# Patient Record
Sex: Male | Born: 1950 | Race: White | Hispanic: No | Marital: Married | Smoking: Never smoker
Health system: Southern US, Community
[De-identification: ages and names within clinical notes are randomized; demographics above are authoritative.]

## PROBLEM LIST (undated history)

## (undated) DIAGNOSIS — I2510 Atherosclerotic heart disease of native coronary artery without angina pectoris: Secondary | ICD-10-CM

## (undated) DIAGNOSIS — M1990 Unspecified osteoarthritis, unspecified site: Secondary | ICD-10-CM

## (undated) DIAGNOSIS — C801 Malignant (primary) neoplasm, unspecified: Secondary | ICD-10-CM

---

## 2017-12-25 ENCOUNTER — Ambulatory Visit: Payer: Self-pay | Admitting: Orthopedic Surgery

## 2017-12-29 HISTORY — PX: CARDIAC CATHETERIZATION: SHX172

## 2018-01-19 ENCOUNTER — Encounter (HOSPITAL_COMMUNITY)
Admission: RE | Admit: 2018-01-19 | Discharge: 2018-01-19 | Disposition: A | Discharge status: Home / Self Care | Payer: Medicare Other | Source: Ambulatory Visit | Attending: Orthopedic Surgery | Admitting: Orthopedic Surgery

## 2018-01-19 ENCOUNTER — Encounter (HOSPITAL_COMMUNITY): Payer: Self-pay

## 2018-01-19 ENCOUNTER — Other Ambulatory Visit: Payer: Self-pay

## 2018-01-19 DIAGNOSIS — M1611 Unilateral primary osteoarthritis, right hip: Secondary | ICD-10-CM | POA: Insufficient documentation

## 2018-01-19 DIAGNOSIS — Z7982 Long term (current) use of aspirin: Secondary | ICD-10-CM | POA: Diagnosis not present

## 2018-01-19 DIAGNOSIS — Z01818 Encounter for other preprocedural examination: Secondary | ICD-10-CM | POA: Insufficient documentation

## 2018-01-19 DIAGNOSIS — Z79899 Other long term (current) drug therapy: Secondary | ICD-10-CM | POA: Insufficient documentation

## 2018-01-19 HISTORY — DX: Unspecified osteoarthritis, unspecified site: M19.90

## 2018-01-19 HISTORY — DX: Malignant (primary) neoplasm, unspecified: C80.1

## 2018-01-19 LAB — COMPREHENSIVE METABOLIC PANEL WITH GFR
ALT: 19 U/L (ref 17–63)
AST: 21 U/L (ref 15–41)
Albumin: 3.9 g/dL (ref 3.5–5.0)
Alkaline Phosphatase: 78 U/L (ref 38–126)
Anion gap: 9 (ref 5–15)
BUN: 21 mg/dL — ABNORMAL HIGH (ref 6–20)
CO2: 26 mmol/L (ref 22–32)
Calcium: 9.1 mg/dL (ref 8.9–10.3)
Chloride: 106 mmol/L (ref 101–111)
Creatinine, Ser: 0.85 mg/dL (ref 0.61–1.24)
GFR calc Af Amer: >60 mL/min (ref >60)
GFR calc non Af Amer: >60 mL/min (ref >60)
Glucose, Bld: 88 mg/dL (ref 65–99)
Potassium: 4.3 mmol/L (ref 3.5–5.1)
Sodium: 141 mmol/L (ref 135–145)
Total Bilirubin: 0.9 mg/dL (ref 0.3–1.2)
Total Protein: 6.5 g/dL (ref 6.5–8.1)

## 2018-01-19 LAB — PROTIME-INR
INR: 1.02
PROTHROMBIN TIME: 13.3 s (ref 11.4–15.2)

## 2018-01-19 LAB — APTT: aPTT: 32 s (ref 24–36)

## 2018-01-19 LAB — CBC
HCT: 39 % (ref 39.0–52.0)
Hemoglobin: 13.9 g/dL (ref 13.0–17.0)
MCH: 33.3 pg (ref 26.0–34.0)
MCHC: 35.6 g/dL (ref 30.0–36.0)
MCV: 93.3 fL (ref 78.0–100.0)
Platelets: 233 K/uL (ref 150–400)
RBC: 4.18 MIL/uL — ABNORMAL LOW (ref 4.22–5.81)
RDW: 12.8 % (ref 11.5–15.5)
WBC: 7.4 K/uL (ref 4.0–10.5)

## 2018-01-19 LAB — SURGICAL PCR SCREEN
MRSA, PCR: NEGATIVE
Staphylococcus aureus: POSITIVE — AB

## 2018-01-19 NOTE — Patient Instructions (Addendum)
Johnny Graves  01/19/2018   Your procedure is scheduled on: 02-03-18   Report to [facility] Main  Entrance to Admitting at 8:15AM    Call this number if you have problems the morning of surgery [phone]   Remember: Do not eat food or drink liquids :After Midnight.     Take these medicines the morning of surgery with A SIP OF WATER: None                                You may not have any metal on your body including hair pins and              piercings  Do not wear jewelry, lotions, powders or deodorant             Men may shave face and neck.   Do not bring valuables to the hospital. [place].  Contacts, dentures or bridgework may not be worn into surgery.  Leave suitcase in the car. After surgery it may be brought to your room.                Please read over the following fact sheets you were given: _____________________________________________________________________          [facility] - Preparing for Surgery Before surgery, you can play an important role.  Because skin is not sterile, your skin needs to be as free of germs as possible.  You can reduce the number of germs on your skin by washing with CHG (chlorahexidine gluconate) soap before surgery.  CHG is an antiseptic cleaner which kills germs and bonds with the skin to continue killing germs even after washing. Please DO NOT use if you have an allergy to CHG or antibacterial soaps.  If your skin becomes reddened/irritated stop using the CHG and inform your nurse when you arrive at Short Stay. Do not shave (including legs and underarms) for at least 48 hours prior to the first CHG shower.  You may shave your face/neck. Please follow these instructions carefully:  1.  Shower with CHG Soap the night before surgery and the  morning of Surgery.  2.  If you choose to wash your hair, wash your hair first as usual with your  normal  shampoo.  3.   After you shampoo, rinse your hair and body thoroughly to remove the  shampoo.                           4.  Use CHG as you would any other liquid soap.  You can apply chg directly  to the skin and wash                       Gently with a scrungie or clean washcloth.  5.  Apply the CHG Soap to your body ONLY FROM THE NECK DOWN.   Do not use on face/ open                           Wound or open sores. Avoid contact with eyes, ears mouth and genitals (private parts).  Wash face,  Genitals (private parts) with your normal soap.             6.  Wash thoroughly, paying special attention to the area where your surgery  will be performed.  7.  Thoroughly rinse your body with warm water from the neck down.  8.  DO NOT shower/wash with your normal soap after using and rinsing off  the CHG Soap.                9.  Pat yourself dry with a clean towel.            10.  Wear clean pajamas.            11.  Place clean sheets on your bed the night of your first shower and do not  sleep with pets. Day of Surgery : Do not apply any lotions/deodorants the morning of surgery.  Please wear clean clothes to the hospital/surgery center.  FAILURE TO FOLLOW THESE INSTRUCTIONS MAY RESULT IN THE CANCELLATION OF YOUR SURGERY PATIENT SIGNATURE_________________________________  NURSE SIGNATURE__________________________________  ________________________________________________________________________   Johnny Graves  An incentive spirometer is a tool that can help keep your lungs clear and active. This tool measures how well you are filling your lungs with each breath. Taking long deep breaths may help reverse or decrease the chance of developing breathing (pulmonary) problems (especially infection) following:  A long period of time when you are unable to move or be active. BEFORE THE PROCEDURE   If the spirometer includes an indicator to show your best effort, your nurse or respiratory  therapist will set it to a desired goal.  If possible, sit up straight or lean slightly forward. Try not to slouch.  Hold the incentive spirometer in an upright position. INSTRUCTIONS FOR USE  1. Sit on the edge of your bed if possible, or sit up as far as you can in bed or on a chair. 2. Hold the incentive spirometer in an upright position. 3. Breathe out normally. 4. Place the mouthpiece in your mouth and seal your lips tightly around it. 5. Breathe in slowly and as deeply as possible, raising the piston or the ball toward the top of the column. 6. Hold your breath for 3-5 seconds or for as long as possible. Allow the piston or ball to fall to the bottom of the column. 7. Remove the mouthpiece from your mouth and breathe out normally. 8. Rest for a few seconds and repeat Steps 1 through 7 at least 10 times every 1-2 hours when you are awake. Take your time and take a few normal breaths between deep breaths. 9. The spirometer may include an indicator to show your best effort. Use the indicator as a goal to work toward during each repetition. 10. After each set of 10 deep breaths, practice coughing to be sure your lungs are clear. If you have an incision (the cut made at the time of surgery), support your incision when coughing by placing a pillow or rolled up towels firmly against it. Once you are able to get out of bed, walk around indoors and cough well. You may stop using the incentive spirometer when instructed by your caregiver.  RISKS AND COMPLICATIONS  Take your time so you do not get dizzy or light-headed.  If you are in pain, you may need to take or ask for pain medication before doing incentive spirometry. It is harder to take a deep breath if you are having pain.  AFTER USE  Rest and breathe slowly and easily.  It can be helpful to keep track of a log of your progress. Your caregiver can provide you with a simple table to help with this. If you are using the spirometer at home,  follow these instructions: [place] IF:   You are having difficultly using the spirometer.  You have trouble using the spirometer as often as instructed.  Your pain medication is not giving enough relief while using the spirometer.  You develop fever of 100.5 F (38.1 C) or higher. SEEK IMMEDIATE MEDICAL CARE IF:   You cough up bloody sputum that had not been present before.  You develop fever of 102 F (38.9 C) or greater.  You develop worsening pain at or near the incision site. MAKE SURE YOU:   Understand these instructions.  Will watch your condition.  Will get help right away if you are not doing well or get worse. Document Released: 04/06/2007 Document Revised: 02/16/2012 Document Reviewed: 06/07/2007 ExitCare Patient Information 2014 ExitCare, [place].   ________________________________________________________________________  WHAT IS A BLOOD TRANSFUSION? Blood Transfusion Information  A transfusion is the replacement of blood or some of its parts. Blood is made up of multiple cells which provide different functions.  Red blood cells carry oxygen and are used for blood loss replacement.  White blood cells fight against infection.  Platelets control bleeding.  Plasma helps clot blood.  Other blood products are available for specialized needs, such as hemophilia or other clotting disorders. BEFORE THE TRANSFUSION  Who gives blood for transfusions?   Healthy volunteers who are fully evaluated to make sure their blood is safe. This is blood bank blood. Transfusion therapy is the safest it has ever been in the practice of medicine. Before blood is taken from a donor, a complete history is taken to make sure that person has no history of diseases nor engages in risky social behavior (examples are intravenous drug use or sexual activity with multiple partners). The donor's travel history is screened to minimize risk of transmitting infections, such as malaria.  The donated blood is tested for signs of infectious diseases, such as HIV and hepatitis. The blood is then tested to be sure it is compatible with you in order to minimize the chance of a transfusion reaction. If you or a relative donates blood, this is often done in anticipation of surgery and is not appropriate for emergency situations. It takes many days to process the donated blood. RISKS AND COMPLICATIONS Although transfusion therapy is very safe and saves many lives, the main dangers of transfusion include:   Getting an infectious disease.  Developing a transfusion reaction. This is an allergic reaction to something in the blood you were given. Every precaution is taken to prevent this. The decision to have a blood transfusion has been considered carefully by your caregiver before blood is given. Blood is not given unless the benefits outweigh the risks. AFTER THE TRANSFUSION  Right after receiving a blood transfusion, you will usually feel much better and more energetic. This is especially true if your red blood cells have gotten low (anemic). The transfusion raises the level of the red blood cells which carry oxygen, and this usually causes an energy increase.  The nurse administering the transfusion will monitor you carefully for complications. HOME CARE INSTRUCTIONS  No special instructions are needed after a transfusion. You may find your energy is better. Speak with your caregiver about any limitations on activity for underlying diseases  you may have. SEEK MEDICAL CARE IF:   Your condition is not improving after your transfusion.  You develop redness or irritation at the intravenous (IV) site. SEEK IMMEDIATE MEDICAL CARE IF:  Any of the following symptoms occur over the next 12 hours:  Shaking chills.  You have a temperature by mouth above 102 F (38.9 C), not controlled by medicine.  Chest, back, or muscle pain.  People around you feel you are not acting correctly or are  confused.  Shortness of breath or difficulty breathing.  Dizziness and fainting.  You get a rash or develop hives.  You have a decrease in urine output.  Your urine turns a dark color or changes to pink, red, or brown. Any of the following symptoms occur over the next 10 days:  You have a temperature by mouth above 102 F (38.9 C), not controlled by medicine.  Shortness of breath.  Weakness after normal activity.  The white part of the eye turns yellow (jaundice).  You have a decrease in the amount of urine or are urinating less often.  Your urine turns a dark color or changes to pink, red, or brown. Document Released: 11/21/2000 Document Revised: 02/16/2012 Document Reviewed: 07/10/2008 [facility] Patient Information 2014 [place], [place].  _______________________________________________________________________

## 2018-01-20 ENCOUNTER — Ambulatory Visit: Payer: Self-pay | Admitting: Orthopedic Surgery

## 2018-01-20 NOTE — H&P (Signed)
Name  Johnny Graves, Johnny Graves [phone], M) ID# [id number]     DOB  12/12/1950             Chief Complaint  Right Hip Pain  H&P for right THA on 02/03/2018 at [facility]    Patient's Pharmacies  [id number]): [address], [place] [address], Ph [phone], Fax [phone]   Vitals  01/19/2018 10:18 am Ht:  6 ft   Wt:  170 lbs   BMI:  23.1   Pain Scale:  6       BP - 138/54 Pulse - 54  Allergies   Reviewed Allergies   NKDA     Medications   Reviewed Medications       Aspir-81  01/19/18 entered  Johnny Graves   CoQ-10  01/19/18 entered  Johnny Graves   diclofenac sodium 75 mg tablet,delayed release  12/03/17 filled  [facility]   Fish Oil 1,000 mg (120 mg-180 mg) capsule  Take by oral route.  01/19/18 entered  Johnny Graves   Glucosamine 1500 Complex 500 mg-400 mg capsule  Take by oral route.  01/19/18 entered  Johnny Graves   Niacin (niacinamide) 500 mg tablet  Take by oral route.  01/19/18 entered  Johnny Graves   red yeast rice 600 mg capsule  Take by oral route.  01/19/18 entered  Johnny Graves   Problems  Reviewed Problems   Family History  Reviewed Family History Father  - Father deceased    - Heart disease   Mother  - Mother deceased    - Family history of cancer     Social History  Reviewed Social History Smoking Status: Never smoker Non-smoker Occupation: Sports coach Chewing tobacco: none Alcohol intake: None Hand Dominance: Right Work related injury?: N Advance directive: N Freight forwarder of Attorney: N    Surgical History  Reviewed Surgical History Patient indicated no previous surgeries on (01/19/2018)    Past Medical History  Reviewed Past Medical History Osteoarthritis: Y Notes: Skin Cancer     HPI  The patient comes in for his H&P for his right THA on 02/03/2018 by Dr. Wynelle Link at [facility].  The patient was initially seen for a second opinion. The patient reported left hip  and right hip problems including pain (mainly right hip) symptoms that have been present for 1 year(s). The symptoms began without any known injury. Symptoms reported include hip pain, difficulty flexing hip, difficulty rotating hip, difficulty bearing weight and difficulty ambulating The patient reports symptoms radiating to the: lower back. Onset of symptoms was gradual. The patient feels as if their symptoms are does feel they are worsening. Symptoms are exacerbated by movement, flexing hip, weight bearing, walking and lying on the affected side. Symptoms are relieved by rest. Prior to being seen in the clinic, the patient was previously evaluated by an out of town physician ([place], [place]) 2 month(s) ago. Symptoms present at the patient's previous evaluation included pain in the hip, difficulty bearing weight and difficulty ambulating. Previous workup for this problem has included hip x-rays and lumbar spine xrays.  Johnny Graves has been told that both of his hips have arthritis but the right hip seems to be the more problematic of the two. The patient states that he has been a lifelong runner but had to stopped due to pain and discomfort ab out a year and a half ago. His hip pain has been progressive with time especially since February of this  year. He went and saw Dr. Megan Salon in [place] and was told that he had arthritis and was started on Diclofenac which helped for quite some time (about 4-5 months). The pain started to return so he was switched over to Etodolac which did not provide much benefit. He then tried Celebrex without benefit. He is now back on the original diclofenac medicine. 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 Once his pain started, he stopped running as stated above and walking the courses became more difficult. he tries to remain active and works out but has gone less frequently over the past three  months. He went back to work about a year and a half ago and fortunately has not missed any work due to the hips but they remain progressive in nature.  Radiographs-AP pelvis and lateral of the hips show severe end-stage arthritic change in both hips with severe bone-on-bone with subchondral cystic formation and osteophyte formation. He has collapse of the RIGHT femoral head. Both sides are severely involved but the RIGHT is slightly worse.  He has advanced end-stage arthritis of both hips RIGHT more symptomatic and worse radiographically than the LEFT. We discussed intra-articular injection for short-term benefit but the most predictable means of long-term improvement in pain and function will be total hip arthroplasty.We discussed the procedure risks and potential complication and rehab course in detail. At this time the patient would like to go ahead and proceed with the total hip arthroplasty on the right.    ROS  Constitutional: Constitutional: no fever, chills, night sweats, or significant weight loss.  Cardiovascular: Cardiovascular: no palpitations or chest pain.  Respiratory: Respiratory: no cough or shortness of breath and No COPD.  Gastrointestinal: Gastrointestinal: no vomiting or nausea.  Musculoskeletal: Musculoskeletal: Joint Pain.  Neurologic: Neurologic: no numbness, tingling, or difficulty with balance.     Physical Exam   Patient is a 67 year old male.  General Mental Status - Alert, cooperative and good historian. General Appearance - pleasant, Not in acute distress. Orientation - Oriented X3. Build & Nutrition - Well nourished and Well developed. Head and Neck Head - normocephalic, atraumatic . Neck Global Assessment - supple, no bruit auscultated on the right, no bruit auscultated on the left. Eye Pupil - Bilateral - PERR  Motion - Bilateral - EOMI. Chest and Lung Exam Auscultation Breath sounds - clear at anterior chest wall and clear at posterior chest wall.  Adventitious sounds - No Adventitious sounds. Cardiovascular Auscultation Rhythm - Regular rate and rhythm. Heart Sounds - S1 WNL and S2 WNL. Murmurs & Other Heart Sounds - Auscultation of the heart reveals - No Murmurs. Abdomen Palpation/Percussion Tenderness - Abdomen is non-tender to palpation. Abdomen is soft. Auscultation Auscultation of the abdomen reveals - Bowel sounds normal. Male Genitourinary Note: Not done, not pertinent to present illness Musculoskeletal His RIGHT hip can be flexed to 100 with minimal internal rotation, 10 of external rotation and 10 of abduction. His LEFT hip can be flexed to 100 with about 10 of internal rotation maybe 20 of external rotation 20 of abduction. He has an antalgic gait pattern. Knee exam is normal bilaterally. Pulses, sensation and motor are intact.  Radiographs-AP pelvis and lateral of the hips show severe end-stage arthritic change in both hips with severe bone-on-bone with subchondral cystic formation and osteophyte formation. He has collapse of the RIGHT femoral head. Both sides are severely involved but the RIGHT is slightly worse. Marland Kitchen  Assessment / Plan   1. 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 - Follow up visit in 2 weeks with Dr. Wynelle Link - Begin physical therapy following surgery - Pre-operative lab work as pre Pre-Surgical Testing - Prescriptions will be provided in hospital at time of discharge  Return to [place], PA-C for [place] at [facility] on 02/17/2018 at 02:15 PM    Encounter signed-off by Mickel Crow, PA-C

## 2018-01-20 NOTE — Progress Notes (Signed)
01-20-18 PCR results routed to Dr. Wynelle Link for review.

## 2018-02-03 ENCOUNTER — Encounter (HOSPITAL_COMMUNITY): Admission: RE | Payer: Self-pay | Source: Ambulatory Visit

## 2018-02-03 ENCOUNTER — Inpatient Hospital Stay (HOSPITAL_COMMUNITY): Admission: RE | Admit: 2018-02-03 | Payer: Medicare Other | Source: Ambulatory Visit | Admitting: Orthopedic Surgery

## 2018-02-03 SURGERY — ARTHROPLASTY, HIP, TOTAL, ANTERIOR APPROACH
Anesthesia: Choice | Site: Hip | Laterality: Right

## 2018-02-05 HISTORY — PX: JOINT REPLACEMENT: SHX530

## 2018-02-12 NOTE — Progress Notes (Signed)
Please place orders in Epic as patient is being scheduled for a pre-op appointment! Thank you! 

## 2018-02-15 ENCOUNTER — Ambulatory Visit: Payer: Self-pay | Admitting: Orthopedic Surgery

## 2018-02-18 NOTE — Progress Notes (Addendum)
02-12-18 Cardiac Clearance on chart from [facility]  02-01-18 LOV Dr. Sabra Heck Cardiologist  02-01-18 Stress Test

## 2018-02-18 NOTE — Patient Instructions (Addendum)
Johnny Graves  02/18/2018   Your procedure is scheduled on: 02-22-18   Report to [facility] Main  Entrance Report to Admitting at 11:15 AM   Call this number if you have problems the morning of surgery [phone]   Remember: Do not eat food or drink liquids :After Midnight. You may have Clear Liquid Diet from Midnight until 7:45 AM. After 7:45 AM, nothing until after surgery.     CLEAR LIQUID DIET   Foods Allowed                                                                     Foods Excluded  Coffee and tea, regular and decaf                             liquids that you cannot  Plain Jell-O in any flavor                                             see through such as: Fruit ices (not with fruit pulp)                                     milk, soups, orange juice  Iced Popsicles                                    All solid food Carbonated beverages, regular and diet                                    Cranberry, grape and apple juices Sports drinks like Gatorade Lightly seasoned clear broth or consume(fat free) Sugar, honey syrup  Sample Menu Breakfast                                Lunch                                     Supper Cranberry juice                    Beef broth                            Chicken broth Jell-O                                     Grape juice                           Apple juice Coffee  or tea                        Jell-O                                      Popsicle                                                Coffee or tea                        Coffee or tea  _____________________________________________________________________     Take these medicines the morning of surgery with A SIP OF WATER: Metoprolol Succinate (Toprol-XL)                                You may not have any metal on your body including hair pins and              piercings  Do not wear jewelry, lotions, powders or deodorant             Men  may shave face and neck.   Do not bring valuables to the hospital. [place].  Contacts, dentures or bridgework may not be worn into surgery.  Leave suitcase in the car. After surgery it may be brought to your room.                 Please read over the following fact sheets you were given: _____________________________________________________________________          [facility] - Preparing for Surgery Before surgery, you can play an important role.  Because skin is not sterile, your skin needs to be as free of germs as possible.  You can reduce the number of germs on your skin by washing with CHG (chlorahexidine gluconate) soap before surgery.  CHG is an antiseptic cleaner which kills germs and bonds with the skin to continue killing germs even after washing. Please DO NOT use if you have an allergy to CHG or antibacterial soaps.  If your skin becomes reddened/irritated stop using the CHG and inform your nurse when you arrive at Short Stay. Do not shave (including legs and underarms) for at least 48 hours prior to the first CHG shower.  You may shave your face/neck. Please follow these instructions carefully:  1.  Shower with CHG Soap the night before surgery and the  morning of Surgery.  2.  If you choose to wash your hair, wash your hair first as usual with your  normal  shampoo.  3.  After you shampoo, rinse your hair and body thoroughly to remove the  shampoo.                           4.  Use CHG as you would any other liquid soap.  You can apply chg directly  to the skin and wash  Gently with a scrungie or clean washcloth.  5.  Apply the CHG Soap to your body ONLY FROM THE NECK DOWN.   Do not use on face/ open                           Wound or open sores. Avoid contact with eyes, ears mouth and genitals (private parts).                       Wash face,  Genitals (private parts) with your normal soap.             6.   Wash thoroughly, paying special attention to the area where your surgery  will be performed.  7.  Thoroughly rinse your body with warm water from the neck down.  8.  DO NOT shower/wash with your normal soap after using and rinsing off  the CHG Soap.                9.  Pat yourself dry with a clean towel.            10.  Wear clean pajamas.            11.  Place clean sheets on your bed the night of your first shower and do not  sleep with pets. Day of Surgery : Do not apply any lotions/deodorants the morning of surgery.  Please wear clean clothes to the hospital/surgery center.  FAILURE TO FOLLOW THESE INSTRUCTIONS MAY RESULT IN THE CANCELLATION OF YOUR SURGERY PATIENT SIGNATURE_________________________________  NURSE SIGNATURE__________________________________  ________________________________________________________________________   [name]  An incentive spirometer is a tool that can help keep your lungs clear and active. This tool measures how well you are filling your lungs with each breath. Taking long deep breaths may help reverse or decrease the chance of developing breathing (pulmonary) problems (especially infection) following:  A long period of time when you are unable to move or be active. BEFORE THE PROCEDURE   If the spirometer includes an indicator to show your best effort, your nurse or respiratory therapist will set it to a desired goal.  If possible, sit up straight or lean slightly forward. Try not to slouch.  Hold the incentive spirometer in an upright position. INSTRUCTIONS FOR USE  1. Sit on the edge of your bed if possible, or sit up as far as you can in bed or on a chair. 2. Hold the incentive spirometer in an upright position. 3. Breathe out normally. 4. Place the mouthpiece in your mouth and seal your lips tightly around it. 5. Breathe in slowly and as deeply as possible, raising the piston or the ball toward the top of the column. 6. Hold  your breath for 3-5 seconds or for as long as possible. Allow the piston or ball to fall to the bottom of the column. 7. Remove the mouthpiece from your mouth and breathe out normally. 8. Rest for a few seconds and repeat Steps 1 through 7 at least 10 times every 1-2 hours when you are awake. Take your time and take a few normal breaths between deep breaths. 9. The spirometer may include an indicator to show your best effort. Use the indicator as a goal to work toward during each repetition. 10. After each set of 10 deep breaths, practice coughing to be sure your lungs are clear. If you have an incision (the cut made at the time of  surgery), support your incision when coughing by placing a pillow or rolled up towels firmly against it. Once you are able to get out of bed, walk around indoors and cough well. You may stop using the incentive spirometer when instructed by your caregiver.  RISKS AND COMPLICATIONS  Take your time so you do not get dizzy or light-headed.  If you are in pain, you may need to take or ask for pain medication before doing incentive spirometry. It is harder to take a deep breath if you are having pain. AFTER USE  Rest and breathe slowly and easily.  It can be helpful to keep track of a log of your progress. Your caregiver can provide you with a simple table to help with this. If you are using the spirometer at home, follow these instructions: [place] IF:   You are having difficultly using the spirometer.  You have trouble using the spirometer as often as instructed.  Your pain medication is not giving enough relief while using the spirometer.  You develop fever of 100.5 F (38.1 C) or higher. SEEK IMMEDIATE MEDICAL CARE IF:   You cough up bloody sputum that had not been present before.  You develop fever of 102 F (38.9 C) or greater.  You develop worsening pain at or near the incision site. MAKE SURE YOU:   Understand these instructions.  Will  watch your condition.  Will get help right away if you are not doing well or get worse. Document Released: 04/06/2007 Document Revised: 02/16/2012 Document Reviewed: 06/07/2007 ExitCare Patient Information 2014 ExitCare, [place].   ________________________________________________________________________  WHAT IS A BLOOD TRANSFUSION? Blood Transfusion Information  A transfusion is the replacement of blood or some of its parts. Blood is made up of multiple cells which provide different functions.  Red blood cells carry oxygen and are used for blood loss replacement.  White blood cells fight against infection.  Platelets control bleeding.  Plasma helps clot blood.  Other blood products are available for specialized needs, such as hemophilia or other clotting disorders. BEFORE THE TRANSFUSION  Who gives blood for transfusions?   Healthy volunteers who are fully evaluated to make sure their blood is safe. This is blood bank blood. Transfusion therapy is the safest it has ever been in the practice of medicine. Before blood is taken from a donor, a complete history is taken to make sure that person has no history of diseases nor engages in risky social behavior (examples are intravenous drug use or sexual activity with multiple partners). The donor's travel history is screened to minimize risk of transmitting infections, such as malaria. The donated blood is tested for signs of infectious diseases, such as HIV and hepatitis. The blood is then tested to be sure it is compatible with you in order to minimize the chance of a transfusion reaction. If you or a relative donates blood, this is often done in anticipation of surgery and is not appropriate for emergency situations. It takes many days to process the donated blood. RISKS AND COMPLICATIONS Although transfusion therapy is very safe and saves many lives, the main dangers of transfusion include:   Getting an infectious disease.  Developing a  transfusion reaction. This is an allergic reaction to something in the blood you were given. Every precaution is taken to prevent this. The decision to have a blood transfusion has been considered carefully by your caregiver before blood is given. Blood is not given unless the benefits outweigh the risks. AFTER THE  TRANSFUSION  Right after receiving a blood transfusion, you will usually feel much better and more energetic. This is especially true if your red blood cells have gotten low (anemic). The transfusion raises the level of the red blood cells which carry oxygen, and this usually causes an energy increase.  The nurse administering the transfusion will monitor you carefully for complications. HOME CARE INSTRUCTIONS  No special instructions are needed after a transfusion. You may find your energy is better. Speak with your caregiver about any limitations on activity for underlying diseases you may have. SEEK MEDICAL CARE IF:   Your condition is not improving after your transfusion.  You develop redness or irritation at the intravenous (IV) site. SEEK IMMEDIATE MEDICAL CARE IF:  Any of the following symptoms occur over the next 12 hours:  Shaking chills.  You have a temperature by mouth above 102 F (38.9 C), not controlled by medicine.  Chest, back, or muscle pain.  People around you feel you are not acting correctly or are confused.  Shortness of breath or difficulty breathing.  Dizziness and fainting.  You get a rash or develop hives.  You have a decrease in urine output.  Your urine turns a dark color or changes to pink, red, or brown. Any of the following symptoms occur over the next 10 days:  You have a temperature by mouth above 102 F (38.9 C), not controlled by medicine.  Shortness of breath.  Weakness after normal activity.  The white part of the eye turns yellow (jaundice).  You have a decrease in the amount of urine or are urinating less often.  Your  urine turns a dark color or changes to pink, red, or brown. Document Released: 11/21/2000 Document Revised: 02/16/2012 Document Reviewed: 07/10/2008 [facility] Patient Information 2014 [place], [place].  _______________________________________________________________________

## 2018-02-19 ENCOUNTER — Ambulatory Visit: Payer: Self-pay | Admitting: Orthopedic Surgery

## 2018-02-19 ENCOUNTER — Encounter (HOSPITAL_COMMUNITY): Payer: Self-pay

## 2018-02-19 ENCOUNTER — Other Ambulatory Visit: Payer: Self-pay

## 2018-02-19 ENCOUNTER — Encounter (HOSPITAL_COMMUNITY)
Admission: RE | Admit: 2018-02-19 | Discharge: 2018-02-19 | Disposition: A | Discharge status: Home / Self Care | Payer: Medicare Other | Source: Ambulatory Visit | Attending: Orthopedic Surgery | Admitting: Orthopedic Surgery

## 2018-02-19 LAB — CBC
HCT: 41.8 % (ref 39.0–52.0)
HEMOGLOBIN: 14.8 g/dL (ref 13.0–17.0)
MCH: 32.7 pg (ref 26.0–34.0)
MCHC: 35.4 g/dL (ref 30.0–36.0)
MCV: 92.3 fL (ref 78.0–100.0)
PLATELETS: 297 10*3/uL (ref 150–400)
RBC: 4.53 MIL/uL (ref 4.22–5.81)
RDW: 12.3 % (ref 11.5–15.5)
WBC: 8.9 10*3/uL (ref 4.0–10.5)

## 2018-02-19 LAB — ABO/RH: ABO/RH(D): O POS

## 2018-02-19 LAB — COMPREHENSIVE METABOLIC PANEL
ALBUMIN: 3.9 g/dL (ref 3.5–5.0)
ALK PHOS: 90 U/L (ref 38–126)
ALT: 14 U/L — ABNORMAL LOW (ref 17–63)
ANION GAP: 10 (ref 5–15)
AST: 16 U/L (ref 15–41)
BUN: 20 mg/dL (ref 6–20)
CHLORIDE: 104 mmol/L (ref 101–111)
CO2: 25 mmol/L (ref 22–32)
Calcium: 9.1 mg/dL (ref 8.9–10.3)
Creatinine, Ser: 0.9 mg/dL (ref 0.61–1.24)
GFR calc non Af Amer: >60 mL/min (ref >60)
GLUCOSE: 92 mg/dL (ref 65–99)
POTASSIUM: 4.2 mmol/L (ref 3.5–5.1)
SODIUM: 139 mmol/L (ref 135–145)
Total Bilirubin: 0.8 mg/dL (ref 0.3–1.2)
Total Protein: 6.8 g/dL (ref 6.5–8.1)

## 2018-02-19 LAB — SURGICAL PCR SCREEN
MRSA, PCR: NEGATIVE
Staphylococcus aureus: NEGATIVE

## 2018-02-19 LAB — APTT: APTT: 31 s (ref 24–36)

## 2018-02-19 LAB — PROTIME-INR
INR: 1
Prothrombin Time: 13.1 seconds (ref 11.4–15.2)

## 2018-02-19 NOTE — H&P (Signed)
Sagun, HAMZEH [phone], M) ID# [id number]      DOB  [birth year]             Chief Complaint  Right Hip Pain  H&P for right THA on 02/03/2018 at [facility]    Patient's Pharmacies  [id number]): [address], [place] [address], Ph [phone], Fax [phone]   Vitals  01/19/2018 10:18 am Ht:  6 ft   Wt:  170 lbs   BMI:  23.1   Pain Scale:  6       BP - 138/54 Pulse - 54  Allergies   Reviewed Allergies   NKDA     Medications       Reviewed Medications       Aspir-81  01/19/18 entered  Dominica Cumine   CoQ-10  01/19/18 entered  Dominica Cumine   diclofenac sodium 75 mg tablet,delayed release  12/03/17 filled  [facility]   Fish Oil 1,000 mg (120 mg-180 mg) capsule  Take by oral route.  01/19/18 entered  Vanessa Cumine   Glucosamine 1500 Complex 500 mg-400 mg capsule  Take by oral route.  01/19/18 entered  Lorriane Shire Cumine   Niacin (niacinamide) 500 mg tablet  Take by oral route.  01/19/18 entered  Vanessa Cumine   red yeast rice 600 mg capsule  Take by oral route.  01/19/18 entered  Lorriane Shire Cumine   Problems  Reviewed Problems   Family History  Reviewed Family History Father  - Father deceased    - Heart disease   Mother  - Mother deceased    - Family history of cancer     Social History  Reviewed Social History Smoking Status: Never smoker Non-smoker Occupation: Sports coach Chewing tobacco: none Alcohol intake: None Hand Dominance: Right Work related injury?: N Advance directive: N Freight forwarder of Attorney: N    Surgical History  Reviewed Surgical History Patient indicated no previous surgeries on (01/19/2018)    Past Medical History  Reviewed Past Medical History Osteoarthritis: Y Notes: Skin Cancer     HPI  The patient comes in for his H&P for his right THA on 02/03/2018 by Dr. Wynelle Link at [facility].  The patient was initially seen for a  second opinion. The patient reported left hip and right hip problems including pain (mainly right hip) symptoms that have been present for 1 year(s). The symptoms began without any known injury. Symptoms reported include hip pain, difficulty flexing hip, difficulty rotating hip, difficulty bearing weight and difficulty ambulating The patient reports symptoms radiating to the: lower back. Onset of symptoms was gradual. The patient feels as if their symptoms are does feel they are worsening. Symptoms are exacerbated by movement, flexing hip, weight bearing, walking and lying on the affected side. Symptoms are relieved by rest. Prior to being seen in the clinic, the patient was previously evaluated by an out of town physician ([place], [place]) 2 month(s) ago. Symptoms present at the patient's previous evaluation included pain in the hip, difficulty bearing weight and difficulty ambulating. Previous workup for this problem has included hip x-rays and lumbar spine xrays.  Mr. Petter has been told that both of his hips have arthritis but the right hip seems to be the more problematic of the two. The patient states that he has been a lifelong runner but had to stopped due to pain and discomfort ab out a year and a half ago. His hip pain has been progressive with time especially since  February of this year. He went and saw Dr. Megan Salon in [place] and was told that he had arthritis and was started on Diclofenac which helped for quite some time (about 4-5 months). The pain started to return so he was switched over to Etodolac which did not provide much benefit. He then tried Celebrex without benefit. He is now back on the original diclofenac medicine. 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 Once his pain started, he stopped running as stated above and walking the courses became more difficult. he tries to remain active and works out but has  gone less frequently over the past three months. He went back to work about a year and a half ago and fortunately has not missed any work due to the hips but they remain progressive in nature.  Radiographs-AP pelvis and lateral of the hips show severe end-stage arthritic change in both hips with severe bone-on-bone with subchondral cystic formation and osteophyte formation. He has collapse of the RIGHT femoral head. Both sides are severely involved but the RIGHT is slightly worse.  He has advanced end-stage arthritis of both hips RIGHT more symptomatic and worse radiographically than the LEFT. We discussed intra-articular injection for short-term benefit but the most predictable means of long-term improvement in pain and function will be total hip arthroplasty.We discussed the procedure risks and potential complication and rehab course in detail. At this time the patient would like to go ahead and proceed with the total hip arthroplasty on the right.    ROS  Constitutional: Constitutional: no fever, chills, night sweats, or significant weight loss.  Cardiovascular: Cardiovascular: no palpitations or chest pain.  Respiratory: Respiratory: no cough or shortness of breath and No COPD.  Gastrointestinal: Gastrointestinal: no vomiting or nausea.  Musculoskeletal: Musculoskeletal: Joint Pain.  Neurologic: Neurologic: no numbness, tingling, or difficulty with balance.     Physical Exam   Patient is a 67 year old male.  General Mental Status - Alert, cooperative and good historian. General Appearance - pleasant, Not in acute distress. Orientation - Oriented X3. Build & Nutrition - Well nourished and Well developed. Head and Neck Head - normocephalic, atraumatic . Neck Global Assessment - supple, no bruit auscultated on the right, no bruit auscultated on the left. Eye Pupil - Bilateral - PERR  Motion - Bilateral - EOMI. Chest and Lung Exam Auscultation Breath sounds - clear at anterior  chest wall and clear at posterior chest wall. Adventitious sounds - No Adventitious sounds. Cardiovascular Auscultation Rhythm - Regular rate and rhythm. Heart Sounds - S1 WNL and S2 WNL. Murmurs & Other Heart Sounds - Auscultation of the heart reveals - No Murmurs. Abdomen Palpation/Percussion Tenderness - Abdomen is non-tender to palpation. Abdomen is soft. Auscultation Auscultation of the abdomen reveals - Bowel sounds normal. Male Genitourinary Note: Not done, not pertinent to present illness Musculoskeletal His RIGHT hip can be flexed to 100 with minimal internal rotation, 10 of external rotation and 10 of abduction. His LEFT hip can be flexed to 100 with about 10 of internal rotation maybe 20 of external rotation 20 of abduction. He has an antalgic gait pattern. Knee exam is normal bilaterally. Pulses, sensation and motor are intact.  Radiographs-AP pelvis and lateral of the hips show severe end-stage arthritic change in both hips with severe bone-on-bone with subchondral cystic formation and osteophyte formation. He has collapse of the RIGHT femoral head. Both sides are severely involved but the RIGHT  is slightly worse. .  Assessment / Plan   1. 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 - Follow up visit in 2 weeks with Dr. Wynelle Link - Begin physical therapy following surgery - Pre-operative lab work as pre Pre-Surgical Testing - Prescriptions will be provided in hospital at time of discharge  Return to [place], PA-C for [place] at [facility] on 02/17/2018 at 02:15 PM    Encounter signed-off by Mickel Crow, PA-C

## 2018-02-19 NOTE — H&P (Signed)
Name Johnny Graves, Johnny Graves [phone], M) DOB Apr 18, 1951    Chief Complaint Right Hip Pain H&P right THA on 02/22/2018 at [id number]): [address], [place] [address], Ph [phone], Fax [phone]   Vitals Ht: 6 ft  Wt: 170 lbs  BMI: 23.1  BP: 124/64  Pulse: 62 bpm    Allergies Reviewed Allergies NKDA   Medications Reviewed Medications atorvastatin 40 mg tablet 02/03/18   filled [facility] metoprolol succinate ER 25 mg tablet,extended release 24 hr 02/03/18   filled [facility] mupirocin 2 % topical ointment 01/20/18   filled [place] nitroglycerin 0.4 mg sublingual tablet 02/03/18   filled [place]   Family History Reviewed Family History Father - Father deceased   - Heart disease Mother - Mother deceased   - Family history of cancer   Social History Reviewed Social History Smoking Status: Never smoker Non-smoker Occupation: Sports coach Chewing tobacco: none Alcohol intake: None Hand Dominance: Right Work related injury?: N Advance directive: N Freight forwarder of Attorney: N   Surgical History Reviewed Surgical History No reported surgical history   Past Medical History Reviewed Past Medical History Osteoarthritis: Y Notes: Skin Cancer,  CAD    HPI The patient is here today for a pre-operative History and Physical. They are scheduled for right total hip replacement on 02/22/2018 with Dr. Wynelle Link at [facility]. The patient comes in for his H&P for his right THA on 02/22/2018 by Dr. Wynelle Link at [facility]. The patient was initially seen for a second opinion. The patient reported left hip and right hip problems including pain (mainly right hip) symptoms that have been present for 1 year(s). The symptoms began without any known injury. Symptoms reported include hip pain, difficulty flexing hip, difficulty rotating hip, difficulty bearing weight and difficulty ambulating The patient  reports symptoms radiating to the: lower back. Onset of symptoms was gradual. The patient feels as if their symptoms are does feel they are worsening. Symptoms are exacerbated by movement, flexing hip, weight bearing, walking and lying on the affected side. Symptoms are relieved by rest. Prior to being seen in the clinic, the patient was previously evaluated by an out of town physician ([place], [place]) 2 month(s) ago. Symptoms present at the patient's previous evaluation included pain in the hip, difficulty bearing weight and difficulty ambulating. Previous workup for this problem has included hip x-rays and lumbar spine xrays. Johnny Graves has been told that both of his hips have arthritis but the right hip seems to be the more problematic of the two. The patient states that he has been a lifelong runner but had to stopped due to pain and discomfort ab out a year and a half ago. His hip pain has been progressive with time especially since February of this year. He went and saw Dr. Megan Salon in [place] and was told that he had arthritis and was started on Diclofenac which helped for quite some time (about 4-5 months). The pain started to return so he was switched over to Etodolac which did not provide much benefit. He then tried Celebrex without benefit. He is now back on the original diclofenac medicine. 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 Once his pain started, he stopped running as stated above and walking the courses became more difficult. he tries to remain active and works out but has gone less frequently over the past  three months. He went back to work about a year and a half ago and fortunately has not missed any work due to the hips but they remain progressive in nature. Radiographs-AP pelvis and lateral of the hips show severe end-stage arthritic change in both hips with severe bone-on-bone with subchondral cystic  formation and osteophyte formation. He has collapse of the RIGHT femoral head. Both sides are severely involved but the RIGHT is slightly worse. He has advanced end-stage arthritis of both hips RIGHT more symptomatic and worse radiographically than the LEFT. We discussed intra-articular injection for short-term benefit but the most predictable means of long-term improvement in pain and function will be total hip arthroplasty.We discussed the procedure risks and potential complication and rehab course in detail. At this time the patient would like to go ahead and proceed with the total hip arthroplasty on the right. Please note that he was originally scheduled for total hip in February but was postponed due to abnormal stress test. He has undergone cardiac cath and has completed cardiac workup and has been cleared to proceed with surgery at this time.   ROS Constitutional: Constitutional: no fever, chills, night sweats, or significant weight loss.  Cardiovascular: Cardiovascular: no palpitations or chest pain.  Respiratory: Respiratory: no cough or shortness of breath and No COPD.  Gastrointestinal: Gastrointestinal: no vomiting or nausea.  Musculoskeletal: Musculoskeletal: Joint Pain.  Neurologic: Neurologic: no numbness, tingling, or difficulty with balance.   Physical Exam Patient is a 67 year old male.  General Mental Status - Alert, cooperative and good historian. General Appearance - pleasant, Not in acute distress. Orientation - Oriented X3. Build & Nutrition - Well nourished and Well developed.  Head and Neck Head - normocephalic, atraumatic . Neck Global Assessment - supple, no bruit auscultated on the right, no bruit auscultated on the left.  Eye Pupil - Bilateral - PERR Motion - Bilateral - EOMI.  Chest and Lung Exam Auscultation Breath sounds - clear at anterior chest wall and clear at posterior chest wall. Adventitious sounds - No Adventitious  sounds.  Cardiovascular Auscultation Rhythm - Regular rate and rhythm. Heart Sounds - S1 WNL and S2 WNL. Murmurs & Other Heart Sounds - Auscultation of the heart reveals - No Murmurs.  Abdomen Palpation/Percussion Tenderness - Abdomen is non-tender to palpation. Abdomen is soft. Auscultation Auscultation of the abdomen reveals - Bowel sounds normal.  Male Genitourinary Note: Not done, not pertinent to present illness  Musculoskeletal His RIGHT hip can be flexed to 100 with minimal internal rotation, 10 of external rotation and 10 of abduction. His LEFT hip can be flexed to 100 with about 10 of internal rotation maybe 20 of external rotation 20 of abduction. He has an antalgic gait pattern. Knee exam is normal bilaterally. Pulses, sensation and motor are intact. Radiographs-AP pelvis and lateral of the hips show severe end-stage arthritic change in both hips with severe bone-on-bone with subchondral cystic formation and osteophyte formation. He has collapse of the RIGHT femoral head. Both sides are severely involved but the RIGHT is slightly worse.  Assessment / Plan 1. Osteoarthritis of hip M16.11: Unilateral primary osteoarthritis, right hip  Patient Instructions Surgical Plans: Right Total Hip Replacement - Anterior Approach Disposition: Home, Probably HEP, Exercise Sheet Provided to patient. PCP: Richardean Chimera, PA-C Cards: Dr. Orpah Greek Topical TXA - CAD Anesthesia Issues: None Patient was instructed on what medications to stop prior to surgery. - Follow up visit in 2 weeks with Dr. Wynelle Link - Begin physical therapy following surgery - Pre-operative  lab work as pre Water engineer - Prescriptions will be provided in hospital at time of discharge  Return to [place], MD for [place] at [facility] on 03/09/2018 at 01:15 PM  Encounter signed-off by Mickel Crow, PA-C

## 2018-02-19 NOTE — H&P (View-Only) (Signed)
Name Johnny Graves, Johnny Graves [phone], M) DOB 1951/09/28    Chief Complaint Right Hip Pain H&P right THA on 02/22/2018 at [id number]): [address], [place] [address], Ph [phone], Fax [phone]   Vitals Ht: 6 ft  Wt: 170 lbs  BMI: 23.1  BP: 124/64  Pulse: 62 bpm    Allergies Reviewed Allergies NKDA   Medications Reviewed Medications atorvastatin 40 mg tablet 02/03/18   filled [facility] metoprolol succinate ER 25 mg tablet,extended release 24 hr 02/03/18   filled [facility] mupirocin 2 % topical ointment 01/20/18   filled [place] nitroglycerin 0.4 mg sublingual tablet 02/03/18   filled [place]   Family History Reviewed Family History Father - Father deceased   - Heart disease Mother - Mother deceased   - Family history of cancer   Social History Reviewed Social History Smoking Status: Never smoker Non-smoker Occupation: Sports coach Chewing tobacco: none Alcohol intake: None Hand Dominance: Right Work related injury?: N Advance directive: N Freight forwarder of Attorney: N   Surgical History Reviewed Surgical History No reported surgical history   Past Medical History Reviewed Past Medical History Osteoarthritis: Y Notes: Skin Cancer,  CAD    HPI The patient is here today for a pre-operative History and Physical. They are scheduled for right total hip replacement on 02/22/2018 with Johnny Graves at [facility]. The patient comes in for his H&P for his right THA on 02/22/2018 by Johnny Graves at [facility]. The patient was initially seen for a second opinion. The patient reported left hip and right hip problems including pain (mainly right hip) symptoms that have been present for 1 year(s). The symptoms began without any known injury. Symptoms reported include hip pain, difficulty flexing hip, difficulty rotating hip, difficulty bearing weight and difficulty ambulating The patient  reports symptoms radiating to the: lower back. Onset of symptoms was gradual. The patient feels as if their symptoms are does feel they are worsening. Symptoms are exacerbated by movement, flexing hip, weight bearing, walking and lying on the affected side. Symptoms are relieved by rest. Prior to being seen in the clinic, the patient was previously evaluated by an out of town physician ([place], [place]) 2 month(s) ago. Symptoms present at the patient's previous evaluation included pain in the hip, difficulty bearing weight and difficulty ambulating. Previous workup for this problem has included hip x-rays and lumbar spine xrays. Johnny Graves has been told that both of his hips have arthritis but the right hip seems to be the more problematic of the two. The patient states that he has been a lifelong runner but had to stopped due to pain and discomfort ab out a year and a half ago. His hip pain has been progressive with time especially since February of this year. He went and saw Johnny Graves in [place] and was told that he had arthritis and was started on Diclofenac which helped for quite some time (about 4-5 months). The pain started to return so he was switched over to Etodolac which did not provide much benefit. He then tried Celebrex without benefit. He is now back on the original diclofenac medicine. 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 Once his pain started, he stopped running as stated above and walking the courses became more difficult. he tries to remain active and works out but has gone less frequently over the past  three months. He went back to work about a year and a half ago and fortunately has not missed any work due to the hips but they remain progressive in nature. Radiographs-AP pelvis and lateral of the hips show severe end-stage arthritic change in both hips with severe bone-on-bone with subchondral cystic  formation and osteophyte formation. He has collapse of the RIGHT femoral head. Both sides are severely involved but the RIGHT is slightly worse. He has advanced end-stage arthritis of both hips RIGHT more symptomatic and worse radiographically than the LEFT. We discussed intra-articular injection for short-term benefit but the most predictable means of long-term improvement in pain and function will be total hip arthroplasty.We discussed the procedure risks and potential complication and rehab course in detail. At this time the patient would like to go ahead and proceed with the total hip arthroplasty on the right. Please note that he was originally scheduled for total hip in February but was postponed due to abnormal stress test. He has undergone cardiac cath and has completed cardiac workup and has been cleared to proceed with surgery at this time.   ROS Constitutional: Constitutional: no fever, chills, night sweats, or significant weight loss.  Cardiovascular: Cardiovascular: no palpitations or chest pain.  Respiratory: Respiratory: no cough or shortness of breath and No COPD.  Gastrointestinal: Gastrointestinal: no vomiting or nausea.  Musculoskeletal: Musculoskeletal: Joint Pain.  Neurologic: Neurologic: no numbness, tingling, or difficulty with balance.   Physical Exam Patient is a 67 year old male.  General Mental Status - Alert, cooperative and good historian. General Appearance - pleasant, Not in acute distress. Orientation - Oriented X3. Build & Nutrition - Well nourished and Well developed.  Head and Neck Head - normocephalic, atraumatic . Neck Global Assessment - supple, no bruit auscultated on the right, no bruit auscultated on the left.  Eye Pupil - Bilateral - PERR Motion - Bilateral - EOMI.  Chest and Lung Exam Auscultation Breath sounds - clear at anterior chest wall and clear at posterior chest wall. Adventitious sounds - No Adventitious  sounds.  Cardiovascular Auscultation Rhythm - Regular rate and rhythm. Heart Sounds - S1 WNL and S2 WNL. Murmurs & Other Heart Sounds - Auscultation of the heart reveals - No Murmurs.  Abdomen Palpation/Percussion Tenderness - Abdomen is non-tender to palpation. Abdomen is soft. Auscultation Auscultation of the abdomen reveals - Bowel sounds normal.  Male Genitourinary Note: Not done, not pertinent to present illness  Musculoskeletal His RIGHT hip can be flexed to 100 with minimal internal rotation, 10 of external rotation and 10 of abduction. His LEFT hip can be flexed to 100 with about 10 of internal rotation maybe 20 of external rotation 20 of abduction. He has an antalgic gait pattern. Knee exam is normal bilaterally. Pulses, sensation and motor are intact. Radiographs-AP pelvis and lateral of the hips show severe end-stage arthritic change in both hips with severe bone-on-bone with subchondral cystic formation and osteophyte formation. He has collapse of the RIGHT femoral head. Both sides are severely involved but the RIGHT is slightly worse.  Assessment / Plan 1. Osteoarthritis of hip M16.11: Unilateral primary osteoarthritis, right hip  Patient Instructions Surgical Plans: Right Total Hip Replacement - Anterior Approach Disposition: Home, Probably HEP, Exercise Sheet Provided to patient. PCP: Richardean Chimera, PA-C Cards: Dr. Orpah Greek Topical TXA - CAD Anesthesia Issues: None Patient was instructed on what medications to stop prior to surgery. - Follow up visit in 2 weeks with Johnny Graves - Begin physical therapy following surgery - Pre-operative  lab work as pre Water engineer - Prescriptions will be provided in hospital at time of discharge  Return to [place], MD for [place] at [facility] on 03/09/2018 at 01:15 PM  Encounter signed-off by Mickel Crow, PA-C

## 2018-02-21 MED ORDER — TRANEXAMIC ACID 1000 MG/10ML IV SOLN
2000.0000 mg | Freq: Once | INTRAVENOUS | Status: DC
  Filled 2018-02-21: qty 20

## 2018-02-22 ENCOUNTER — Inpatient Hospital Stay (HOSPITAL_COMMUNITY): Payer: Medicare Other

## 2018-02-22 ENCOUNTER — Encounter (HOSPITAL_COMMUNITY): Payer: Self-pay | Admitting: *Deleted

## 2018-02-22 ENCOUNTER — Encounter (HOSPITAL_COMMUNITY)
Admission: RE | Disposition: A | Discharge status: Home / Self Care | Payer: Self-pay | Source: Ambulatory Visit | Attending: Orthopedic Surgery

## 2018-02-22 ENCOUNTER — Inpatient Hospital Stay (HOSPITAL_COMMUNITY): Payer: Medicare Other | Admitting: Anesthesiology

## 2018-02-22 ENCOUNTER — Inpatient Hospital Stay (HOSPITAL_COMMUNITY)
Admission: RE | Admit: 2018-02-22 | Discharge: 2018-02-23 | DRG: 470 | Disposition: A | Discharge status: Home / Self Care | Payer: Medicare Other | Source: Ambulatory Visit | Attending: Orthopedic Surgery | Admitting: Orthopedic Surgery

## 2018-02-22 ENCOUNTER — Other Ambulatory Visit: Payer: Self-pay

## 2018-02-22 DIAGNOSIS — Z79899 Other long term (current) drug therapy: Secondary | ICD-10-CM

## 2018-02-22 DIAGNOSIS — Z85828 Personal history of other malignant neoplasm of skin: Secondary | ICD-10-CM | POA: Diagnosis not present

## 2018-02-22 DIAGNOSIS — I2510 Atherosclerotic heart disease of native coronary artery without angina pectoris: Secondary | ICD-10-CM | POA: Diagnosis present

## 2018-02-22 DIAGNOSIS — M1611 Unilateral primary osteoarthritis, right hip: Principal | ICD-10-CM | POA: Diagnosis present

## 2018-02-22 DIAGNOSIS — Z96649 Presence of unspecified artificial hip joint: Secondary | ICD-10-CM

## 2018-02-22 DIAGNOSIS — M169 Osteoarthritis of hip, unspecified: Secondary | ICD-10-CM | POA: Diagnosis present

## 2018-02-22 HISTORY — PX: TOTAL HIP ARTHROPLASTY: SHX124

## 2018-02-22 LAB — TYPE AND SCREEN
ABO/RH(D): O POS
ANTIBODY SCREEN: NEGATIVE

## 2018-02-22 SURGERY — ARTHROPLASTY, HIP, TOTAL, ANTERIOR APPROACH
Anesthesia: Spinal | Site: Hip | Laterality: Right

## 2018-02-22 MED ORDER — PROPOFOL 10 MG/ML IV BOLUS
INTRAVENOUS | Status: AC
  Filled 2018-02-22: qty 20

## 2018-02-22 MED ORDER — NITROGLYCERIN 0.4 MG SL SUBL: 0.4000 mg | SUBLINGUAL_TABLET | SUBLINGUAL | Status: DC | PRN

## 2018-02-22 MED ORDER — POLYETHYLENE GLYCOL 3350 17 G PO PACK: 17.0000 g | PACK | Freq: Every day | ORAL | Status: DC | PRN

## 2018-02-22 MED ORDER — HYDROCODONE-ACETAMINOPHEN 7.5-325 MG PO TABS
1.0000 | ORAL_TABLET | ORAL | Status: DC | PRN
  Administered 2018-02-22: 20:00:00 1 via ORAL
  Administered 2018-02-23 (×3): 2 via ORAL
  Filled 2018-02-22 (×4): qty 2
  Filled 2018-02-22: qty 1

## 2018-02-22 MED ORDER — EPHEDRINE 5 MG/ML INJ
INTRAVENOUS | Status: AC
  Filled 2018-02-22: qty 10

## 2018-02-22 MED ORDER — PROPOFOL 10 MG/ML IV BOLUS
INTRAVENOUS | Status: DC | PRN
  Administered 2018-02-22: 20 mg via INTRAVENOUS

## 2018-02-22 MED ORDER — METHOCARBAMOL 500 MG PO TABS
500.0000 mg | ORAL_TABLET | Freq: Four times a day (QID) | ORAL | Status: DC | PRN
  Administered 2018-02-23: 500 mg via ORAL
  Filled 2018-02-22 (×2): qty 1

## 2018-02-22 MED ORDER — ONDANSETRON HCL 4 MG PO TABS: 4.0000 mg | ORAL_TABLET | Freq: Four times a day (QID) | ORAL | Status: DC | PRN

## 2018-02-22 MED ORDER — DEXAMETHASONE SODIUM PHOSPHATE 10 MG/ML IJ SOLN
INTRAMUSCULAR | Status: AC
  Filled 2018-02-22: qty 1

## 2018-02-22 MED ORDER — DEXAMETHASONE SODIUM PHOSPHATE 10 MG/ML IJ SOLN
10.0000 mg | Freq: Once | INTRAMUSCULAR | Status: AC
  Administered 2018-02-22: 10 mg via INTRAVENOUS

## 2018-02-22 MED ORDER — EPHEDRINE SULFATE-NACL 50-0.9 MG/10ML-% IV SOSY
PREFILLED_SYRINGE | INTRAVENOUS | Status: DC | PRN
  Administered 2018-02-22 (×2): 10 mg via INTRAVENOUS

## 2018-02-22 MED ORDER — BUPIVACAINE-EPINEPHRINE (PF) 0.5% -1:200000 IJ SOLN
INTRAMUSCULAR | Status: AC
  Filled 2018-02-22: qty 30

## 2018-02-22 MED ORDER — BISACODYL 10 MG RE SUPP: 10.0000 mg | Freq: Every day | RECTAL | Status: DC | PRN

## 2018-02-22 MED ORDER — ATORVASTATIN CALCIUM 40 MG PO TABS
40.0000 mg | ORAL_TABLET | Freq: Every evening | ORAL | Status: DC
  Administered 2018-02-22: 20:00:00 40 mg via ORAL
  Filled 2018-02-22: qty 1

## 2018-02-22 MED ORDER — CEFAZOLIN SODIUM-DEXTROSE 2-4 GM/100ML-% IV SOLN
INTRAVENOUS | Status: AC
  Filled 2018-02-22: qty 100

## 2018-02-22 MED ORDER — CHLORHEXIDINE GLUCONATE 4 % EX LIQD: 60.0000 mL | Freq: Once | CUTANEOUS | Status: DC

## 2018-02-22 MED ORDER — HYDROCODONE-ACETAMINOPHEN 5-325 MG PO TABS: 1.0000 | ORAL_TABLET | ORAL | Status: DC | PRN

## 2018-02-22 MED ORDER — SODIUM CHLORIDE 0.9 % IV SOLN
INTRAVENOUS | Status: DC
  Administered 2018-02-22: 18:00:00 via INTRAVENOUS

## 2018-02-22 MED ORDER — PROPOFOL 500 MG/50ML IV EMUL
INTRAVENOUS | Status: DC | PRN
  Administered 2018-02-22: 120 ug/kg/min via INTRAVENOUS

## 2018-02-22 MED ORDER — PHENOL 1.4 % MT LIQD: 1.0000 | OROMUCOSAL | Status: DC | PRN

## 2018-02-22 MED ORDER — FENTANYL CITRATE (PF) 100 MCG/2ML IJ SOLN
INTRAMUSCULAR | Status: DC | PRN
  Administered 2018-02-22: 100 ug via INTRAVENOUS

## 2018-02-22 MED ORDER — ACETAMINOPHEN 10 MG/ML IV SOLN
INTRAVENOUS | Status: AC
Start: 2018-02-22 — End: 2018-02-22
  Filled 2018-02-22: qty 100

## 2018-02-22 MED ORDER — DOCUSATE SODIUM 100 MG PO CAPS
100.0000 mg | ORAL_CAPSULE | Freq: Two times a day (BID) | ORAL | Status: DC
  Administered 2018-02-22 – 2018-02-23 (×2): 100 mg via ORAL
  Filled 2018-02-22 (×2): qty 1

## 2018-02-22 MED ORDER — CEFAZOLIN SODIUM-DEXTROSE 2-4 GM/100ML-% IV SOLN
2.0000 g | INTRAVENOUS | Status: AC
  Administered 2018-02-22: 2 g via INTRAVENOUS

## 2018-02-22 MED ORDER — LACTATED RINGERS IV SOLN
INTRAVENOUS | Status: DC
  Administered 2018-02-22 (×2): via INTRAVENOUS

## 2018-02-22 MED ORDER — BUPIVACAINE-EPINEPHRINE 0.5% -1:200000 IJ SOLN
INTRAMUSCULAR | Status: DC | PRN
  Administered 2018-02-22: 50 mL

## 2018-02-22 MED ORDER — METOCLOPRAMIDE HCL 5 MG PO TABS: 5.0000 mg | ORAL_TABLET | Freq: Three times a day (TID) | ORAL | Status: DC | PRN

## 2018-02-22 MED ORDER — MIDAZOLAM HCL 2 MG/2ML IJ SOLN
INTRAMUSCULAR | Status: AC
  Filled 2018-02-22: qty 2

## 2018-02-22 MED ORDER — CEFAZOLIN SODIUM-DEXTROSE 2-4 GM/100ML-% IV SOLN
2.0000 g | Freq: Four times a day (QID) | INTRAVENOUS | Status: AC
  Administered 2018-02-22 – 2018-02-23 (×2): 2 g via INTRAVENOUS
  Filled 2018-02-22 (×2): qty 100

## 2018-02-22 MED ORDER — STERILE WATER FOR IRRIGATION IR SOLN
Status: DC | PRN
  Administered 2018-02-22: 2000 mL

## 2018-02-22 MED ORDER — FENTANYL CITRATE (PF) 100 MCG/2ML IJ SOLN
INTRAMUSCULAR | Status: AC
  Filled 2018-02-22: qty 2

## 2018-02-22 MED ORDER — FLEET ENEMA 7-19 GM/118ML RE ENEM: 1.0000 | ENEMA | Freq: Once | RECTAL | Status: DC | PRN

## 2018-02-22 MED ORDER — 0.9 % SODIUM CHLORIDE (POUR BTL) OPTIME
TOPICAL | Status: DC | PRN
  Administered 2018-02-22: 1000 mL

## 2018-02-22 MED ORDER — ACETAMINOPHEN 10 MG/ML IV SOLN
1000.0000 mg | Freq: Once | INTRAVENOUS | Status: AC
  Administered 2018-02-22: 1000 mg via INTRAVENOUS

## 2018-02-22 MED ORDER — METOPROLOL SUCCINATE ER 25 MG PO TB24
25.0000 mg | ORAL_TABLET | Freq: Every day | ORAL | Status: DC
  Filled 2018-02-22: qty 1

## 2018-02-22 MED ORDER — RIVAROXABAN 10 MG PO TABS
10.0000 mg | ORAL_TABLET | Freq: Every day | ORAL | Status: DC
  Administered 2018-02-23: 08:00:00 10 mg via ORAL
  Filled 2018-02-22: qty 1

## 2018-02-22 MED ORDER — MENTHOL 3 MG MT LOZG: 1.0000 | LOZENGE | OROMUCOSAL | Status: DC | PRN

## 2018-02-22 MED ORDER — TRAMADOL HCL 50 MG PO TABS: 50.0000 mg | ORAL_TABLET | Freq: Four times a day (QID) | ORAL | Status: DC | PRN

## 2018-02-22 MED ORDER — METOCLOPRAMIDE HCL 5 MG/ML IJ SOLN: 5.0000 mg | Freq: Three times a day (TID) | INTRAMUSCULAR | Status: DC | PRN

## 2018-02-22 MED ORDER — HYDROMORPHONE HCL 1 MG/ML IJ SOLN: 0.2500 mg | INTRAMUSCULAR | Status: DC | PRN

## 2018-02-22 MED ORDER — METHOCARBAMOL 1000 MG/10ML IJ SOLN
500.0000 mg | Freq: Four times a day (QID) | INTRAVENOUS | Status: DC | PRN
  Administered 2018-02-22: 500 mg via INTRAVENOUS
  Filled 2018-02-22: qty 550

## 2018-02-22 MED ORDER — DEXAMETHASONE SODIUM PHOSPHATE 10 MG/ML IJ SOLN
10.0000 mg | Freq: Once | INTRAMUSCULAR | Status: AC
  Administered 2018-02-23: 10:00:00 10 mg via INTRAVENOUS
  Filled 2018-02-22: qty 1

## 2018-02-22 MED ORDER — PHENYLEPHRINE HCL 10 MG/ML IJ SOLN
INTRAMUSCULAR | Status: AC
  Filled 2018-02-22: qty 1

## 2018-02-22 MED ORDER — MIDAZOLAM HCL 5 MG/5ML IJ SOLN
INTRAMUSCULAR | Status: DC | PRN
  Administered 2018-02-22: 2 mg via INTRAVENOUS

## 2018-02-22 MED ORDER — PROPOFOL 10 MG/ML IV BOLUS
INTRAVENOUS | Status: AC
  Filled 2018-02-22: qty 40

## 2018-02-22 MED ORDER — MORPHINE SULFATE (PF) 2 MG/ML IV SOLN
0.5000 mg | INTRAVENOUS | Status: DC | PRN
  Administered 2018-02-22: 22:00:00 0.5 mg via INTRAVENOUS
  Filled 2018-02-22: qty 1

## 2018-02-22 MED ORDER — PHENYLEPHRINE HCL 10 MG/ML IJ SOLN
INTRAVENOUS | Status: DC | PRN
  Administered 2018-02-22: 50 ug/min via INTRAVENOUS

## 2018-02-22 MED ORDER — ONDANSETRON HCL 4 MG/2ML IJ SOLN
INTRAMUSCULAR | Status: AC
  Filled 2018-02-22: qty 2

## 2018-02-22 MED ORDER — ONDANSETRON HCL 4 MG/2ML IJ SOLN: 4.0000 mg | Freq: Four times a day (QID) | INTRAMUSCULAR | Status: DC | PRN

## 2018-02-22 MED ORDER — ONDANSETRON HCL 4 MG/2ML IJ SOLN
INTRAMUSCULAR | Status: DC | PRN
  Administered 2018-02-22: 4 mg via INTRAVENOUS

## 2018-02-22 MED ORDER — DIPHENHYDRAMINE HCL 12.5 MG/5ML PO ELIX: 12.5000 mg | ORAL_SOLUTION | ORAL | Status: DC | PRN

## 2018-02-22 SURGICAL SUPPLY — 35 items
BAG DECANTER FOR FLEXI CONT (MISCELLANEOUS) ×3 IMPLANT
BAG ZIPLOCK 12X15 (MISCELLANEOUS) IMPLANT
BLADE SAG 18X100X1.27 (BLADE) ×3 IMPLANT
CAPT HIP TOTAL 2 ×3 IMPLANT
CLOSURE WOUND 1/2 X4 (GAUZE/BANDAGES/DRESSINGS) ×1
CLOTH BEACON ORANGE TIMEOUT ST (SAFETY) ×3 IMPLANT
COVER PERINEAL POST (MISCELLANEOUS) ×3 IMPLANT
COVER SURGICAL LIGHT HANDLE (MISCELLANEOUS) ×3 IMPLANT
DECANTER SPIKE VIAL GLASS SM (MISCELLANEOUS) ×3 IMPLANT
DRAPE STERI IOBAN 125X83 (DRAPES) ×3 IMPLANT
DRAPE U-SHAPE 47X51 STRL (DRAPES) ×6 IMPLANT
DRSG ADAPTIC 3X8 NADH LF (GAUZE/BANDAGES/DRESSINGS) ×3 IMPLANT
DRSG EMULSION OIL 3X16 NADH (GAUZE/BANDAGES/DRESSINGS) ×3 IMPLANT
DRSG MEPILEX BORDER 4X4 (GAUZE/BANDAGES/DRESSINGS) ×3 IMPLANT
DRSG MEPILEX BORDER 4X8 (GAUZE/BANDAGES/DRESSINGS) ×3 IMPLANT
DURAPREP 26ML APPLICATOR (WOUND CARE) ×3 IMPLANT
ELECT REM PT RETURN 15FT ADLT (MISCELLANEOUS) ×3 IMPLANT
EVACUATOR 1/8 PVC DRAIN (DRAIN) ×3 IMPLANT
GLOVE BIO SURGEON STRL SZ7.5 (GLOVE) ×3 IMPLANT
GLOVE BIO SURGEON STRL SZ8 (GLOVE) ×6 IMPLANT
GLOVE BIOGEL PI IND STRL 8 (GLOVE) ×2 IMPLANT
GLOVE BIOGEL PI INDICATOR 8 (GLOVE) ×4
GOWN STRL REUS W/TWL LRG LVL3 (GOWN DISPOSABLE) ×3 IMPLANT
GOWN STRL REUS W/TWL XL LVL3 (GOWN DISPOSABLE) ×3 IMPLANT
PACK ANTERIOR HIP CUSTOM (KITS) ×3 IMPLANT
STRIP CLOSURE SKIN 1/2X4 (GAUZE/BANDAGES/DRESSINGS) ×2 IMPLANT
SUT ETHIBOND NAB CT1 #1 30IN (SUTURE) ×3 IMPLANT
SUT MNCRL AB 4-0 PS2 18 (SUTURE) ×3 IMPLANT
SUT STRATAFIX 0 PDS 27 VIOLET (SUTURE) ×3
SUT VIC AB 2-0 CT1 27 (SUTURE) ×4
SUT VIC AB 2-0 CT1 TAPERPNT 27 (SUTURE) ×2 IMPLANT
SUTURE STRATFX 0 PDS 27 VIOLET (SUTURE) ×1 IMPLANT
SYR 50ML LL SCALE MARK (SYRINGE) IMPLANT
TRAY FOLEY W/METER SILVER 16FR (SET/KITS/TRAYS/PACK) ×3 IMPLANT
YANKAUER SUCT BULB TIP 10FT TU (MISCELLANEOUS) ×3 IMPLANT

## 2018-02-22 NOTE — Op Note (Signed)
OPERATIVE REPORT- TOTAL HIP ARTHROPLASTY   PREOPERATIVE DIAGNOSIS: Osteoarthritis of the Right hip.   POSTOPERATIVE DIAGNOSIS: Osteoarthritis of the Right  hip.   PROCEDURE: Right total hip arthroplasty, anterior approach.   SURGEON: Gaynelle Arabian, MD   ASSISTANT: Arlee Muslim, PA-C  ANESTHESIA:  Spinal  ESTIMATED BLOOD LOSS:-250 mL    DRAINS: Hemovac x1.   COMPLICATIONS: None   CONDITION: PACU - hemodynamically stable.   BRIEF CLINICAL NOTE: Johnny Graves is a 67 y.o. male who has advanced end-  stage arthritis of their Right  hip with progressively worsening pain and  dysfunction.The patient has failed nonoperative management and presents for  total hip arthroplasty.   PROCEDURE IN DETAIL: After successful administration of spinal  anesthetic, the traction boots for the [facility] bed were placed on both  feet and the patient was placed onto the [facility] bed, boots placed into the leg  holders. The Right hip was then isolated from the perineum with plastic  drapes and prepped and draped in the usual sterile fashion. ASIS and  greater trochanter were marked and a oblique incision was made, starting  at about 1 cm lateral and 2 cm distal to the ASIS and coursing towards  the anterior cortex of the femur. The skin was cut with a 10 blade  through subcutaneous tissue to the level of the fascia overlying the  tensor fascia lata muscle. The fascia was then incised in line with the  incision at the junction of the anterior third and posterior 2/3rd. The  muscle was teased off the fascia and then the interval between the TFL  and the rectus was developed. The Hohmann retractor was then placed at  the top of the femoral neck over the capsule. The vessels overlying the  capsule were cauterized and the fat on top of the capsule was removed.  A Hohmann retractor was then placed anterior underneath the rectus  femoris to give exposure to the entire anterior capsule. A T-shaped   capsulotomy was performed. The edges were tagged and the femoral head  was identified.       Osteophytes are removed off the superior acetabulum.  The femoral neck was then cut in situ with an oscillating saw. Traction  was then applied to the left lower extremity utilizing the [facility]  traction. The femoral head was then removed. Retractors were placed  around the acetabulum and then circumferential removal of the labrum was  performed. Osteophytes were also removed. Reaming starts at 49 mm to  medialize and  Increased in 2 mm increments to 53 mm. We reamed in  approximately 40 degrees of abduction, 20 degrees anteversion. A 54 mm  pinnacle acetabular shell was then impacted in anatomic position under  fluoroscopic guidance with excellent purchase. We did not need to place  any additional dome screws. A 36 mm neutral + 4 marathon liner was then  placed into the acetabular shell.       The femoral lift was then placed along the lateral aspect of the femur  just distal to the vastus ridge. The leg was  externally rotated and capsule  was stripped off the inferior aspect of the femoral neck down to the  level of the lesser trochanter, this was done with electrocautery. The femur was lifted after this was performed. The  leg was then placed in an extended and adducted position essentially delivering the femur. We also removed the capsule superiorly and the piriformis from the piriformis  fossa to gain excellent exposure of the  proximal femur. Rongeur was used to remove some cancellous bone to get  into the lateral portion of the proximal femur for placement of the  initial starter reamer. The starter broaches was placed  the starter broach  and was shown to go down the center of the canal. Broaching  with the  [place] system was then performed starting at size 8, coursing  Up to size 15. A size 15 had excellent torsional and rotational  and axial stability. The trial high offset neck was then  placed  with a 36 + 5 trial head. The hip was then reduced. We confirmed that  the stem was in the canal both on AP and lateral x-rays. It also has excellent sizing. The hip was reduced with outstanding stability through full extension and full external rotation.. AP pelvis was taken and the leg lengths were measured and found to be equal. Hip was then dislocated again and the femoral head and neck removed. The  femoral broach was removed. Size 15 [place] stem with a high offset  neck was then impacted into the femur following native anteversion. Has  excellent purchase in the canal. Excellent torsional and rotational and  axial stability. It is confirmed to be in the canal on AP and lateral  fluoroscopic views. The 36 + 5 ceramic head was placed and the hip  reduced with outstanding stability. Again AP pelvis was taken and it  confirmed that the leg lengths were equal. The wound was then copiously  irrigated with saline solution and the capsule reattached and repaired  with Ethibond suture. 30 ml of .25% Bupivicaine was  injected into the capsule and into the edge of the tensor fascia lata as well as subcutaneous tissue. The fascia overlying the tensor fascia lata was then closed with a running #1 V-Loc. Subcu was closed with interrupted 2-0 Vicryl and subcuticular running 4-0 Monocryl. Incision was cleaned  and dried. Steri-Strips and a bulky sterile dressing applied. Hemovac  drain was hooked to suction and then the patient was awakened and transported to  recovery in stable condition.        Please note that a surgical assistant was a medical necessity for this procedure to perform it in a safe and expeditious manner. Assistant was necessary to provide appropriate retraction of vital neurovascular structures and to prevent femoral fracture and allow for anatomic placement of the prosthesis.  Gaynelle Arabian, M.D.

## 2018-02-22 NOTE — Anesthesia Postprocedure Evaluation (Signed)
Anesthesia Post Note  Patient: Johnny Graves  Procedure(s) Performed: RIGHT TOTAL HIP ARTHROPLASTY ANTERIOR APPROACH (Right Hip)     Patient location during evaluation: PACU Anesthesia Type: Spinal Level of consciousness: awake Pain management: pain level controlled Vital Signs Assessment: post-procedure vital signs reviewed and stable Cardiovascular status: stable Postop Assessment: spinal receding Anesthetic complications: no    Last Vitals:  Vitals:   02/22/18 1715 02/22/18 1730  BP: 115/65 114/62  Pulse: 68 (!) 58  Resp: 16 15  Temp: 36.4 C 36.5 C  SpO2: 100% 100%    Last Pain:  Vitals:   02/22/18 1730  TempSrc:   PainSc: 1                  Kabe Mckoy

## 2018-02-22 NOTE — Anesthesia Preprocedure Evaluation (Addendum)
Anesthesia Evaluation  Patient identified by MRN, date of birth, ID band Patient awake    Reviewed: Allergy & Precautions, NPO status , Patient's Chart, lab work & pertinent test results  Airway Mallampati: II  TM Distance: >3 FB     Dental   Pulmonary neg pulmonary ROS,    breath sounds clear to auscultation       Cardiovascular negative cardio ROS   Rhythm:Regular Rate:Normal     Neuro/Psych    GI/Hepatic negative GI ROS, Neg liver ROS,   Endo/Other  negative endocrine ROS  Renal/GU negative Renal ROS     Musculoskeletal  (+) Arthritis ,   Abdominal   Peds  Hematology   Anesthesia Other Findings   Reproductive/Obstetrics                             Anesthesia Physical Anesthesia Plan  ASA: III  Anesthesia Plan: Spinal   Post-op Pain Management:    Induction: Intravenous  PONV Risk Score and Plan: 2 and Treatment may vary due to age or medical condition  Airway Management Planned: Nasal Cannula and Simple Face Mask  Additional Equipment:   Intra-op Plan:   Post-operative Plan:   Informed Consent: I have reviewed the patients History and Physical, chart, labs and discussed the procedure including the risks, benefits and alternatives for the proposed anesthesia with the patient or authorized representative who has indicated his/her understanding and acceptance.   Dental advisory given  Plan Discussed with: CRNA and Anesthesiologist  Anesthesia Plan Comments:        Anesthesia Quick Evaluation

## 2018-02-22 NOTE — Anesthesia Procedure Notes (Signed)
Date/Time: 02/22/2018 2:35 PM Performed by: Sharlette Dense, CRNA Oxygen Delivery Method: Simple face mask

## 2018-02-22 NOTE — Interval H&P Note (Signed)
History and Physical Interval Note:  02/22/2018 11:53 AM  Johnny Graves  has presented today for surgery, with the diagnosis of Osteoarthritis Right Hip  The various methods of treatment have been discussed with the patient and family. After consideration of risks, benefits and other options for treatment, the patient has consented to  Procedure(s): RIGHT TOTAL HIP ARTHROPLASTY ANTERIOR APPROACH (Right) as a surgical intervention .  The patient's history has been reviewed, patient examined, no change in status, stable for surgery.  I have reviewed the patient's chart and labs.  Questions were answered to the patient's satisfaction.     Pilar Plate Branden Shallenberger

## 2018-02-22 NOTE — Transfer of Care (Signed)
Immediate Anesthesia Transfer of Care Note  Patient: Johnny Graves  Procedure(s) Performed: RIGHT TOTAL HIP ARTHROPLASTY ANTERIOR APPROACH (Right Hip)  Patient Location: PACU  Anesthesia Type:Spinal  Level of Consciousness: awake  Airway & Oxygen Therapy: Patient Spontanous Breathing and Patient connected to face mask oxygen  Post-op Assessment: Report given to RN and Post -op Vital signs reviewed and stable  Post vital signs: Reviewed and stable  Last Vitals:  Vitals:   02/22/18 1111 02/22/18 1630  BP: (!) 159/76   Pulse: (!) 55 81  Resp: 16 18  Temp: 36.4 C 36.5 C  SpO2: 100% 100%    Last Pain:  Vitals:   02/22/18 1125  TempSrc:   PainSc: 6          Complications: No apparent anesthesia complications

## 2018-02-22 NOTE — Anesthesia Procedure Notes (Signed)
Spinal  Patient location during procedure: OR Start time: 02/22/2018 2:37 PM End time: 02/22/2018 2:42 PM Staffing Resident/CRNA: Sharlette Dense, CRNA Performed: resident/CRNA  Preanesthetic Checklist Completed: patient identified, site marked, surgical consent, pre-op evaluation, timeout performed, IV checked, risks and benefits discussed and monitors and equipment checked Spinal Block Patient position: sitting Prep: Betadine Patient monitoring: heart rate, continuous pulse ox and blood pressure Approach: midline Location: L4-5 Injection technique: single-shot Needle Needle type: Sprotte  Needle gauge: 24 G Needle length: 9 cm Additional Notes Kit expiration date 06/07/2019 and lot #[id number] Clear free flow CSF, negative heme, negative paresthesia Tolerated well and returned to supine position

## 2018-02-23 LAB — BASIC METABOLIC PANEL
ANION GAP: 8 (ref 5–15)
BUN: 16 mg/dL (ref 6–20)
CHLORIDE: 105 mmol/L (ref 101–111)
CO2: 25 mmol/L (ref 22–32)
CREATININE: 0.86 mg/dL (ref 0.61–1.24)
Calcium: 8.6 mg/dL — ABNORMAL LOW (ref 8.9–10.3)
GFR calc non Af Amer: >60 mL/min (ref >60)
Glucose, Bld: 157 mg/dL — ABNORMAL HIGH (ref 65–99)
POTASSIUM: 4.2 mmol/L (ref 3.5–5.1)
SODIUM: 138 mmol/L (ref 135–145)

## 2018-02-23 LAB — CBC
HCT: 35.4 % — ABNORMAL LOW (ref 39.0–52.0)
HEMOGLOBIN: 12.3 g/dL — AB (ref 13.0–17.0)
MCH: 32.1 pg (ref 26.0–34.0)
MCHC: 34.7 g/dL (ref 30.0–36.0)
MCV: 92.4 fL (ref 78.0–100.0)
Platelets: 270 10*3/uL (ref 150–400)
RBC: 3.83 MIL/uL — AB (ref 4.22–5.81)
RDW: 12.3 % (ref 11.5–15.5)
WBC: 14.7 10*3/uL — AB (ref 4.0–10.5)

## 2018-02-23 MED ORDER — TRAMADOL HCL 50 MG PO TABS: 50.0000 mg | ORAL_TABLET | Freq: Four times a day (QID) | ORAL | 0 refills | Status: DC | PRN

## 2018-02-23 MED ORDER — METHOCARBAMOL 500 MG PO TABS: 500.0000 mg | ORAL_TABLET | Freq: Four times a day (QID) | ORAL | 0 refills | Status: DC | PRN

## 2018-02-23 MED ORDER — RIVAROXABAN 10 MG PO TABS: 10.0000 mg | ORAL_TABLET | Freq: Every day | ORAL | 0 refills | Status: DC

## 2018-02-23 MED ORDER — HYDROCODONE-ACETAMINOPHEN 5-325 MG PO TABS: 1.0000 | ORAL_TABLET | ORAL | 0 refills | Status: DC | PRN

## 2018-02-23 NOTE — Discharge Instructions (Signed)
Dr. Gaynelle Arabian Total Joint Specialist [facility] [address], [place], [place] [address] [phone]  ANTERIOR APPROACH TOTAL HIP REPLACEMENT POSTOPERATIVE DIRECTIONS   Hip Rehabilitation, Guidelines Following Surgery  The results of a hip operation are greatly improved after range of motion and muscle strengthening exercises. Follow all safety measures which are given to protect your hip. If any of these exercises cause increased pain or swelling in your joint, decrease the amount until you are comfortable again. Then slowly increase the exercises. Call your caregiver if you have problems or questions.   HOME CARE INSTRUCTIONS  Remove items at home which could result in a fall. This includes throw rugs or furniture in walking pathways.   ICE to the affected hip every three hours for 30 minutes at a time and then as needed for pain and swelling.  Continue to use ice on the hip for pain and swelling from surgery. You may notice swelling that will progress down to the foot and ankle.  This is normal after surgery.  Elevate the leg when you are not up walking on it.    Continue to use the breathing machine which will help keep your temperature down.  It is common for your temperature to cycle up and down following surgery, especially at night when you are not up moving around and exerting yourself.  The breathing machine keeps your lungs expanded and your temperature down.   DIET You may resume your previous home diet once your are discharged from the hospital.  DRESSING / WOUND CARE / SHOWERING You may shower 3 days after surgery, but keep the wounds dry during showering.  You may use an occlusive plastic wrap (Press'n Seal for example), NO SOAKING/SUBMERGING IN THE BATHTUB.  If the bandage gets wet, change with a clean dry gauze.  If the incision gets wet, pat the wound dry with a clean towel. You may start showering once you are discharged home but do not submerge  the incision under water. Just pat the incision dry and apply a dry gauze dressing on daily. Change the surgical dressing daily and reapply a dry dressing each time.  ACTIVITY Walk with your walker as instructed. Use walker as long as suggested by your caregivers. Avoid periods of inactivity such as sitting longer than an hour when not asleep. This helps prevent blood clots.  You may resume a sexual relationship in one month or when given the OK by your doctor.  You may return to work once you are cleared by your doctor.  Do not drive a car for 6 weeks or until released by you surgeon.  Do not drive while taking narcotics.  WEIGHT BEARING Weight bearing as tolerated with assist device (walker, cane, etc) as directed, use it as long as suggested by your surgeon or therapist, typically at least 4-6 weeks.  POSTOPERATIVE CONSTIPATION PROTOCOL Constipation - defined medically as fewer than three stools per week and severe constipation as less than one stool per week.  One of the most common issues patients have following surgery is constipation.  Even if you have a regular bowel pattern at home, your normal regimen is likely to be disrupted due to multiple reasons following surgery.  Combination of anesthesia, postoperative narcotics, change in appetite and fluid intake all can affect your bowels.  In order to avoid complications following surgery, here are some recommendations in order to help you during your recovery period.  Colace (docusate) - Pick up an over-the-counter  form of Colace or another stool softener and take twice a day as long as you are requiring postoperative pain medications.  Take with a full glass of water daily.  If you experience loose stools or diarrhea, hold the colace until you stool forms back up.  If your symptoms do not get better within 1 week or if they get worse, check with your doctor.  Dulcolax (bisacodyl) - Pick up over-the-counter and take as directed by the  product packaging as needed to assist with the movement of your bowels.  Take with a full glass of water.  Use this product as needed if not relieved by Colace only.   MiraLax (polyethylene glycol) - Pick up over-the-counter to have on hand.  MiraLax is a solution that will increase the amount of water in your bowels to assist with bowel movements.  Take as directed and can mix with a glass of water, juice, soda, coffee, or tea.  Take if you go more than two days without a movement. Do not use MiraLax more than once per day. Call your doctor if you are still constipated or irregular after using this medication for 7 days in a row.  If you continue to have problems with postoperative constipation, please contact the office for further assistance and recommendations.  If you experience "the worst abdominal pain ever" or develop nausea or vomiting, please contact the office immediatly for further recommendations for treatment.  ITCHING  If you experience itching with your medications, try taking only a single pain pill, or even half a pain pill at a time.  You can also use Benadryl over the counter for itching or also to help with sleep.   TED HOSE STOCKINGS Wear the elastic stockings on both legs for three weeks following surgery during the day but you may remove then at night for sleeping.  MEDICATIONS See your medication summary on the After Visit Summary that the nursing staff will review with you prior to discharge.  You may have some home medications which will be placed on hold until you complete the course of blood thinner medication.  It is important for you to complete the blood thinner medication as prescribed by your surgeon.  Continue your approved medications as instructed at time of discharge.  PRECAUTIONS If you experience chest pain or shortness of breath - call 911 immediately for transfer to the hospital emergency department.  If you develop a fever greater that 101 F, purulent  drainage from wound, increased redness or drainage from wound, foul odor from the wound/dressing, or calf pain - CONTACT YOUR SURGEON.                                                   FOLLOW-UP APPOINTMENTS Make sure you keep all of your appointments after your operation with your surgeon and caregivers. You should call the office at the above phone number and make an appointment for approximately two weeks after the date of your surgery or on the date instructed by your surgeon outlined in the "After Visit Summary".  RANGE OF MOTION AND STRENGTHENING EXERCISES  These exercises are designed to help you keep full movement of your hip joint. Follow your caregiver's or physical therapist's instructions. Perform all exercises about fifteen times, three times per day or as directed. Exercise both hips, even if you  have had only one joint replacement. These exercises can be done on a training (exercise) mat, on the floor, on a table or on a bed. Use whatever works the best and is most comfortable for you. Use music or television while you are exercising so that the exercises are a pleasant break in your day. This will make your life better with the exercises acting as a break in routine you can look forward to.  Lying on your back, slowly slide your foot toward your buttocks, raising your knee up off the floor. Then slowly slide your foot back down until your leg is straight again.  Lying on your back spread your legs as far apart as you can without causing discomfort.  Lying on your side, raise your upper leg and foot straight up from the floor as far as is comfortable. Slowly lower the leg and repeat.  Lying on your back, tighten up the muscle in the front of your thigh (quadriceps muscles). You can do this by keeping your leg straight and trying to raise your heel off the floor. This helps strengthen the largest muscle supporting your knee.  Lying on your back, tighten up the muscles of your buttocks both  with the legs straight and with the knee bent at a comfortable angle while keeping your heel on the floor.   IF YOU ARE TRANSFERRED TO A SKILLED REHAB FACILITY If the patient is transferred to a skilled rehab facility following release from the hospital, a list of the current medications will be sent to the facility for the patient to continue.  When discharged from the skilled rehab facility, please have the facility set up the patient's [place] prior to being released. Also, the skilled facility will be responsible for providing the patient with their medications at time of release from the facility to include their pain medication, the muscle relaxants, and their blood thinner medication. If the patient is still at the rehab facility at time of the two week follow up appointment, the skilled rehab facility will also need to assist the patient in arranging follow up appointment in our office and any transportation needs.  MAKE SURE YOU:  Understand these instructions.  Get help right away if you are not doing well or get worse.    Pick up stool softner and laxative for home use following surgery while on pain medications. Do not submerge incision under water. Please use good hand washing techniques while changing dressing each day. May shower starting three days after surgery. Please use a clean towel to pat the incision dry following showers. Continue to use ice for pain and swelling after surgery. Do not use any lotions or creams on the incision until instructed by your surgeon.  Take Xarelto for two and a half more weeks following discharge from the hospital, then discontinue Xarelto. Once the patient has completed the Xarelto, they may resume the 81 mg Aspirin.

## 2018-02-23 NOTE — Progress Notes (Signed)
Patient discharged to home with wife. Given all belongings, instructions, prescriptions, equipment. Wife present for all teaching. Both verbalized understanding of all instructions. Escorted to pov via w/c.

## 2018-02-23 NOTE — Progress Notes (Signed)
   Subjective: 1 Day Post-Op Procedure(s) (LRB): RIGHT TOTAL HIP ARTHROPLASTY ANTERIOR APPROACH (Right) Patient reports pain as mild.   Patient seen in rounds for Dr. Wynelle Link. Wife in room Patient is well, but has had some minor complaints of pain in the hip, requiring pain medications We will start therapy today.  If they do well with therapy and meets all goals, then will allow home later this afternoon following therapy. Plan is to go Home after hospital stay.  Objective: Vital signs in last 24 hours: Temp:  [97.5 F (36.4 C)-97.7 F (36.5 C)] 97.7 F (36.5 C) (03/19 0600) Pulse Rate:  [55-81] 59 (03/19 0600) Resp:  [14-19] 16 (03/19 0600) BP: (104-159)/(53-76) 129/61 (03/19 0600) SpO2:  [98 %-100 %] 98 % (03/19 0600) Weight:  [76.2 kg (168 lb)] 76.2 kg (168 lb) (03/18 1125)  Intake/Output from previous day:  Intake/Output Summary (Last 24 hours) at 02/23/2018 0919 Last data filed at 02/23/2018 0834 Gross per 24 hour  Intake 3741.67 ml  Output 2530 ml  Net 1211.67 ml    Intake/Output this shift: Total I/O In: 240 [P.O.:240] Out: -   Labs: Recent Labs    02/23/18 0553  HGB 12.3*   Recent Labs    02/23/18 0553  WBC 14.7*  RBC 3.83*  HCT 35.4*  PLT 270   Recent Labs    02/23/18 0553  NA 138  K 4.2  CL 105  CO2 25  BUN 16  CREATININE 0.86  GLUCOSE 157*  CALCIUM 8.6*   No results for input(s): LABPT, INR in the last 72 hours.  EXAM General - Patient is Alert, Appropriate and Oriented Extremity - Neurovascular intact Sensation intact distally Intact pulses distally Dorsiflexion/Plantar flexion intact Dressing - dressing C/D/I Motor Function - intact, moving foot and toes well on exam.  Hemovac pulled without difficulty.  Past Medical History:  Diagnosis Date  . Arthritis   . 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   Weight as of this encounter: 76.2 kg (168 lb). Up with therapy  DVT Prophylaxis - Xarelto Weight Bearing As Tolerated right Leg Hemovac Pulled Begin Therapy  If meets goals and able to go home: Up with therapy Diet - Regular diet Follow up - in 2 weeks Activity - WBAT Disposition - Home Condition Upon Discharge - pending therapy D/C Meds - See DC Summary DVT Prophylaxis - Xarelto  Arlee Muslim, PA-C Orthopaedic Surgery 02/23/2018, 9:19 AM

## 2018-02-23 NOTE — Evaluation (Signed)
Physical Therapy Evaluation Patient Details Name: Johnny Graves MRN: [id number] DOB: 1951/12/04 Today's Date: 02/23/2018   History of Present Illness  s/p R THA  Clinical Impression  Pt is s/p THA resulting in the deficits listed below (see PT Problem List).   Pt is anxious to go home today; He did well, amb 160' with RW and supervision this am; will see how he does this afternoon, likely will be able to go d/c today; he wants to practice stairs so he can watch his big screen tv;   Pt will benefit from skilled PT to increase their independence and safety with mobility to allow discharge to the venue listed below.      Follow Up Recommendations Follow surgeon's recommendation for DC plan and follow-up therapies    Equipment Recommendations  3in1 (PT)    Recommendations for Other Services       Precautions / Restrictions Precautions Precautions: Fall Restrictions Weight Bearing Restrictions: No Other Position/Activity Restrictions: WBAT      Mobility  Bed Mobility Overal bed mobility: Needs Assistance Bed Mobility: Supine to Sit     Supine to sit: Min guard     General bed mobility comments: min/guard for RLE off bed  Transfers Overall transfer level: Needs assistance Equipment used: Rolling walker (2 wheeled) Transfers: Sit to/from Stand Sit to Stand: Min guard         General transfer comment: cues for hand placement  Ambulation/Gait Ambulation/Gait assistance: Min guard;Supervision Ambulation Distance (Feet): 160 Feet Assistive device: Rolling walker (2 wheeled) Gait Pattern/deviations: Step-to pattern;Step-through pattern     General Gait Details: cues for sequence and gait progression  Stairs            Wheelchair Mobility    Modified Rankin (Stroke Patients Only)       Balance                                             Pertinent Vitals/Pain Pain Assessment: 0-10 Pain Score: 2  Pain Location: right  hip Pain Descriptors / Indicators: Operative site guarding Pain Intervention(s): Premedicated before session;Monitored during session    Home Living Family/patient expects to be discharged to:: Private residence Living Arrangements: Spouse/significant other Available Help at Discharge: Family;Available 24 hours/day Type of Home: House Home Access: Stairs to enter   [employer] of Steps: 1 Home Layout: Multi-level;Able to live on main level with bedroom/bathroom Home Equipment: Gilford Rile - 2 wheels;Cane - single point      Prior Function                 Hand Dominance        Extremity/Trunk Assessment   Upper Extremity Assessment Upper Extremity Assessment: Overall WFL for tasks assessed    Lower Extremity Assessment Lower Extremity Assessment: RLE deficits/detail RLE Deficits / Details: ankle WFL; hip flexion 3+/5, knee 3+ to 4/5, slighlty limited by anticipated post op pain       Communication   Communication: No difficulties  Cognition Arousal/Alertness: Awake/alert Behavior During Therapy: WFL for tasks assessed/performed Overall Cognitive Status: Within Functional Limits for tasks assessed                                        General Comments      Exercises  Total Joint Exercises Ankle Circles/Pumps: AROM;Both;10 reps Quad Sets: 5 reps;Both;AROM   Assessment/Plan    PT Assessment Patient needs continued PT services  PT Problem List Decreased strength;Decreased mobility;Decreased knowledge of use of DME;Pain       PT Treatment Interventions DME instruction;Gait training;Stair training;Functional mobility training;Therapeutic activities;Therapeutic exercise    PT Goals (Current goals can be found in the Care Plan section)  Acute Rehab PT Goals Patient Stated Goal: home today if possible PT Goal Formulation: With patient Time For Goal Achievement: 03/02/18 Potential to Achieve Goals: Fair    Frequency 7X/week    Barriers to discharge        Co-evaluation               AM-PAC PT "6 Clicks" Daily Activity  Outcome Measure Difficulty turning over in bed (including adjusting bedclothes, sheets and blankets)?: A Little Difficulty moving from lying on back to sitting on the side of the bed? : Unable Difficulty sitting down on and standing up from a chair with arms (e.g., wheelchair, bedside commode, etc,.)?: Unable Help needed moving to and from a bed to chair (including a wheelchair)?: A Little Help needed walking in hospital room?: A Little Help needed climbing 3-5 steps with a railing? : A Little 6 Click Score: 14    End of Session Equipment Utilized During Treatment: Gait belt Activity Tolerance: Patient tolerated treatment well Patient left: in chair;with call bell/phone within reach;with chair alarm set   PT Visit Diagnosis: Difficulty in walking, not elsewhere classified (R26.2)    Time: 7619-5093 PT Time Calculation (min) (ACUTE ONLY): 19 min   Charges:   PT Evaluation $PT Eval Low Complexity: 1 Low     PT G CodesKenyon Ana, PT Pager: [phone] 02/23/2018   [facility] 02/23/2018, 10:54 AM

## 2018-02-23 NOTE — Progress Notes (Signed)
Discharge planning, no HH needs identified except needs 3n1. Plan for HEP. Has a RW, contacted [facility] to deliver 3n1 to room. [phone]

## 2018-02-23 NOTE — Discharge Summary (Signed)
Physician Discharge Summary   Patient ID: Johnny Graves MRN: [id number] DOB/AGE: [birth year] 67 y.o.  Admit date: 02/22/2018 Discharge date: 02/23/18  Primary Diagnosis:  Osteoarthritis of the Right  hip.   Admission Diagnoses:  Past Medical History:  Diagnosis Date  . Arthritis   . 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   Weight as of this encounter: 76.2 kg (168 lb).  Procedure(s) (LRB): RIGHT TOTAL HIP ARTHROPLASTY ANTERIOR APPROACH (Right)   Consults: None  HPI: Johnny Graves is a 67 y.o. male who has advanced end-  stage arthritis of their Right  hip with progressively worsening pain and  dysfunction.The patient has failed nonoperative management and presents for  total hip arthroplasty.    Laboratory Data: Admission on 02/22/2018  Component Date Value Ref Range Status  . WBC 02/23/2018 14.7* 4.0 - 10.5 K/uL Final  . RBC 02/23/2018 3.83* 4.22 - 5.81 MIL/uL Final  . Hemoglobin 02/23/2018 12.3* 13.0 - 17.0 g/dL Final  . HCT 02/23/2018 35.4* 39.0 - 52.0 % Final  . MCV 02/23/2018 92.4  78.0 - 100.0 fL Final  . MCH 02/23/2018 32.1  26.0 - 34.0 pg Final  . MCHC 02/23/2018 34.7  30.0 - 36.0 g/dL Final  . RDW 02/23/2018 12.3  11.5 - 15.5 % Final  . Platelets 02/23/2018 270  150 - 400 K/uL Final   Performed at [facility], [place] [address], [place], [place] [address]  . Sodium 02/23/2018 138  135 - 145 mmol/L Final  . Potassium 02/23/2018 4.2  3.5 - 5.1 mmol/L Final  . Chloride 02/23/2018 105  101 - 111 mmol/L Final  . CO2 02/23/2018 25  22 - 32 mmol/L Final  . Glucose, Bld 02/23/2018 157* 65 - 99 mg/dL Final  . BUN 02/23/2018 16  6 - 20 mg/dL Final  . Creatinine, Ser 02/23/2018 0.86  0.61 - 1.24 mg/dL Final  . Calcium 02/23/2018 8.6* 8.9 - 10.3 mg/dL  Final  . GFR calc non Af Amer 02/23/2018 >60  >60 mL/min Final  . GFR calc Af Amer 02/23/2018 >60  >60 mL/min Final   Comment: (NOTE) The eGFR has been calculated using the CKD EPI equation. This calculation has not been validated in all clinical situations. eGFR's persistently <60 mL/min signify possible Chronic Kidney Disease.   Georgiann Hahn gap 02/23/2018 8  5 - 15 Final   Performed at [facility], [place] [address], [place], [place] [address]  Hospital Outpatient Visit on 02/19/2018  Component Date Value Ref Range Status  . aPTT 02/19/2018 31  24 - 36 seconds Final   Performed at [facility], [place] [address] [place],  [address]  . WBC 02/19/2018 8.9  4.0 - 10.5 K/uL Final  . RBC 02/19/2018 4.53  4.22 - 5.81 MIL/uL Final  . Hemoglobin 02/19/2018 14.8  13.0 - 17.0 g/dL Final  . HCT 02/19/2018 41.8  39.0 - 52.0 % Final  . MCV 02/19/2018 92.3  78.0 - 100.0 fL Final  . MCH 02/19/2018 32.7  26.0 - 34.0 pg Final  . MCHC 02/19/2018 35.4  30.0 - 36.0 g/dL Final  . RDW 02/19/2018 12.3  11.5 - 15.5 % Final  . Platelets 02/19/2018 297  150 - 400 K/uL Final   Performed  at [facility], [place] [address], [place], [place] [address]  . Sodium 02/19/2018 139  135 - 145 mmol/L Final  . Potassium 02/19/2018 4.2  3.5 - 5.1 mmol/L Final  . Chloride 02/19/2018 104  101 - 111 mmol/L Final  . CO2 02/19/2018 25  22 - 32 mmol/L Final  . Glucose, Bld 02/19/2018 92  65 - 99 mg/dL Final  . BUN 02/19/2018 20  6 - 20 mg/dL Final  . Creatinine, Ser 02/19/2018 0.90  0.61 - 1.24 mg/dL Final  . Calcium 02/19/2018 9.1  8.9 - 10.3 mg/dL Final  . Total Protein 02/19/2018 6.8  6.5 - 8.1 g/dL Final  . Albumin 02/19/2018 3.9  3.5 - 5.0 g/dL Final  . AST 02/19/2018 16  15 - 41 U/L Final  . ALT 02/19/2018 14* 17 - 63 U/L Final  . Alkaline Phosphatase 02/19/2018 90  38 - 126 U/L Final  . Total Bilirubin 02/19/2018 0.8  0.3 - 1.2 mg/dL Final  . GFR calc non Af  Amer 02/19/2018 >60  >60 mL/min Final  . GFR calc Af Amer 02/19/2018 >60  >60 mL/min Final   Comment: (NOTE) The eGFR has been calculated using the CKD EPI equation. This calculation has not been validated in all clinical situations. eGFR's persistently <60 mL/min signify possible Chronic Kidney Disease.   Georgiann Hahn gap 02/19/2018 10  5 - 15 Final   Performed at [facility], [place] [address], [place], [place] [address]  . Prothrombin Time 02/19/2018 13.1  11.4 - 15.2 seconds Final  . INR 02/19/2018 1.00   Final   Performed at [facility], [place] [address] [place], [place] [address]  . ABO/RH(D) 02/19/2018 O POS   Final  . Antibody Screen 02/19/2018 NEG   Final  . Sample Expiration 02/19/2018 02/25/2018   Final  . Extend sample reason 02/19/2018    Final                   Value:NO TRANSFUSIONS OR PREGNANCY IN THE PAST 3 MONTHS Performed at [facility], [place] [address] [place], [place] [address]   . MRSA, PCR 02/19/2018 NEGATIVE  NEGATIVE Final  . Staphylococcus aureus 02/19/2018 NEGATIVE  NEGATIVE Final   Comment: (NOTE) The Xpert SA Assay (FDA approved for NASAL specimens in patients 88 years of age and older), is one component of a comprehensive surveillance program. It is not intended to diagnose infection nor to guide or monitor treatment. Performed at [facility], [place] [address], [place], [place] [address]   . ABO/RH(D) 02/19/2018    Final                   Value:O POS Performed at [facility], [place] [address] [place], [place] [address]   Hospital Outpatient Visit on 01/19/2018  Component Date Value Ref Range Status  . aPTT 01/19/2018 32  24 - 36 seconds Final   Performed at [place], [place] [address], [place], [place] [address]  . WBC 01/19/2018 7.4  4.0 - 10.5 K/uL Final  . RBC 01/19/2018 4.18* 4.22 - 5.81 MIL/uL Final  . Hemoglobin 01/19/2018 13.9  13.0 -  17.0 g/dL Final  . HCT 01/19/2018 39.0  39.0 - 52.0 % Final  . MCV 01/19/2018 93.3  78.0 - 100.0 fL Final  . MCH 01/19/2018 33.3  26.0 - 34.0 pg Final  . MCHC 01/19/2018 35.6  30.0 - 36.0 g/dL  Final  . RDW 01/19/2018 12.8  11.5 - 15.5 % Final  . Platelets 01/19/2018 233  150 - 400 K/uL Final   Performed at [facility], [place] [address], [place], [place] [address]  . Sodium 01/19/2018 141  135 - 145 mmol/L Final  . Potassium 01/19/2018 4.3  3.5 - 5.1 mmol/L Final  . Chloride 01/19/2018 106  101 - 111 mmol/L Final  . CO2 01/19/2018 26  22 - 32 mmol/L Final  . Glucose, Bld 01/19/2018 88  65 - 99 mg/dL Final  . BUN 01/19/2018 21* 6 - 20 mg/dL Final  . Creatinine, Ser 01/19/2018 0.85  0.61 - 1.24 mg/dL Final  . Calcium 01/19/2018 9.1  8.9 - 10.3 mg/dL Final  . Total Protein 01/19/2018 6.5  6.5 - 8.1 g/dL Final  . Albumin 01/19/2018 3.9  3.5 - 5.0 g/dL Final  . AST 01/19/2018 21  15 - 41 U/L Final  . ALT 01/19/2018 19  17 - 63 U/L Final  . Alkaline Phosphatase 01/19/2018 78  38 - 126 U/L Final  . Total Bilirubin 01/19/2018 0.9  0.3 - 1.2 mg/dL Final  . GFR calc non Af Amer 01/19/2018 >60  >60 mL/min Final  . GFR calc Af Amer 01/19/2018 >60  >60 mL/min Final   Comment: (NOTE) The eGFR has been calculated using the CKD EPI equation. This calculation has not been validated in all clinical situations. eGFR's persistently <60 mL/min signify possible Chronic Kidney Disease.   Georgiann Hahn gap 01/19/2018 9  5 - 15 Final   Performed at [facility], [place] [address], [place], [place] [address]  . Prothrombin Time 01/19/2018 13.3  11.4 - 15.2 seconds Final  . INR 01/19/2018 1.02   Final   Performed at [facility], [place] [address], [place], [place] [address]  . MRSA, PCR 01/19/2018 NEGATIVE  NEGATIVE Final  . Staphylococcus aureus 01/19/2018 POSITIVE* NEGATIVE Final   Comment: (NOTE) The Xpert SA Assay (FDA approved for NASAL specimens in  patients 16 years of age and older), is one component of a comprehensive surveillance program. It is not intended to diagnose infection nor to guide or monitor treatment. Performed at [facility], [place] [address] [place], [place] [address]      X-Rays:Dg Pelvis Portable  Result Date: 02/22/2018 CLINICAL DATA:  Status post right hip replacement EXAM: PORTABLE PELVIS 1-2 VIEWS COMPARISON:  None. FINDINGS: Pelvic ring is intact. Right hip prosthesis is noted in satisfactory position. Sclerotic density is noted in the left femoral neck likely related to a bone island. Degenerative changes of left hip joint are seen. IMPRESSION: Status post right hip replacement. Electronically Signed   By: Inez Catalina M.D.   On: 02/22/2018 16:49   Dg C-arm 1-60 Min-no Report  Result Date: 02/22/2018 Fluoroscopy was utilized by the requesting physician.  No radiographic interpretation.    EKG:No orders found for this or any previous visit.   Hospital Course: Patient was admitted to [facility] and taken to the OR and underwent the above state procedure without complications.  Patient tolerated the procedure well and was later transferred to the recovery room and then to the orthopaedic floor for postoperative care.  They were given PO and IV analgesics for pain control following their surgery.  They were given 24 hours of postoperative antibiotics of  Anti-infectives (From admission, onward)   Start     Dose/Rate Route Frequency Ordered Stop   02/22/18 2100  ceFAZolin (ANCEF) IVPB 2g/100 mL premix     2 g 200 mL/hr over 30 Minutes Intravenous Every 6 hours 02/22/18 1735 02/23/18 0413   02/22/18 1127  ceFAZolin (ANCEF) IVPB 2g/100 mL premix     2 g 200 mL/hr over 30 Minutes Intravenous On call to O.R. 02/22/18 1127 02/22/18 1459   02/22/18 1127  ceFAZolin (ANCEF) 2-4 GM/100ML-% IVPB    Comments:  Harvell, Gwendolyn  : cabinet override      02/22/18 1127 02/22/18 1444     and  started on DVT prophylaxis in the form of Xarelto.   PT and OT were ordered for total hip protocol.  The patient was allowed to be WBAT with therapy. Discharge planning was consulted to help with postop disposition and equipment needs.  Patient had a good night on the evening of surgery.  They started to get up OOB with therapy on day one.  Hemovac drain was pulled without difficulty.Dressing was checked and looked okay.Patient was seen in rounds and was ready to go home on day one.  Diet - Regular diet Follow up - in 2 weeks Activity - WBAT Disposition - Home Condition Upon Discharge - stable D/C Meds - See DC Summary DVT Prophylaxis - Xarelto     Discharge Instructions    Call MD / Call 911   Complete by:  As directed    If you experience chest pain or shortness of breath, CALL 911 and be transported to the hospital emergency room.  If you develope a fever above 101 F, pus (white drainage) or increased drainage or redness at the wound, or calf pain, call your surgeon's office.   Change dressing   Complete by:  As directed    You may change your dressing dressing daily with sterile 4 x 4 inch gauze dressing and paper tape.  Do not submerge the incision under water.   Constipation Prevention   Complete by:  As directed    Drink plenty of fluids.  Prune juice may be helpful.  You may use a stool softener, such as Colace (over the counter) 100 mg twice a day.  Use MiraLax (over the counter) for constipation as needed.   Diet - low sodium heart healthy   Complete by:  As directed    Discharge instructions   Complete by:  As directed    Take Xarelto for two and a half more weeks, then discontinue Xarelto. Once the patient has completed the Xarelto, they may resume the 81 mg Aspirin.   Pick up stool softner and laxative for home use following surgery while on pain medications. Do not submerge incision under water. Please use good hand washing techniques while changing dressing each  day. May shower starting three days after surgery. Please use a clean towel to pat the incision dry following showers. Continue to use ice for pain and swelling after surgery. Do not use any lotions or creams on the incision until instructed by your surgeon.  Wear both TED hose on both legs during the day every day for three weeks, but may remove the TED hose at night at home.  Postoperative Constipation Protocol  Constipation - defined medically as fewer than three stools per week and severe constipation as less than one stool per week.  One of the most common issues patients have following surgery is constipation.  Even if you have a regular bowel pattern at home, your normal regimen is likely to be disrupted due to multiple reasons following  surgery.  Combination of anesthesia, postoperative narcotics, change in appetite and fluid intake all can affect your bowels.  In order to avoid complications following surgery, here are some recommendations in order to help you during your recovery period.  Colace (docusate) - Pick up an over-the-counter form of Colace or another stool softener and take twice a day as long as you are requiring postoperative pain medications.  Take with a full glass of water daily.  If you experience loose stools or diarrhea, hold the colace until you stool forms back up.  If your symptoms do not get better within 1 week or if they get worse, check with your doctor.  Dulcolax (bisacodyl) - Pick up over-the-counter and take as directed by the product packaging as needed to assist with the movement of your bowels.  Take with a full glass of water.  Use this product as needed if not relieved by Colace only.   MiraLax (polyethylene glycol) - Pick up over-the-counter to have on hand.  MiraLax is a solution that will increase the amount of water in your bowels to assist with bowel movements.  Take as directed and can mix with a glass of water, juice, soda, coffee, or tea.  Take if  you go more than two days without a movement. Do not use MiraLax more than once per day. Call your doctor if you are still constipated or irregular after using this medication for 7 days in a row.  If you continue to have problems with postoperative constipation, please contact the office for further assistance and recommendations.  If you experience "the worst abdominal pain ever" or develop nausea or vomiting, please contact the office immediatly for further recommendations for treatment.   Do not sit on low chairs, stoools or toilet seats, as it may be difficult to get up from low surfaces   Complete by:  As directed    Driving restrictions   Complete by:  As directed    No driving until released by the physician.   Increase activity slowly as tolerated   Complete by:  As directed    Lifting restrictions   Complete by:  As directed    No lifting until released by the physician.   Patient may shower   Complete by:  As directed    You may shower without a dressing once there is no drainage.  Do not wash over the wound.  If drainage remains, do not shower until drainage stops.   TED hose   Complete by:  As directed    Use stockings (TED hose) for 3 weeks on both leg(s).  You may remove them at night for sleeping.   Weight bearing as tolerated   Complete by:  As directed    Laterality:  right   Extremity:  Lower     Allergies as of 02/23/2018   No Known Allergies     Medication List    STOP taking these medications   aspirin EC 81 MG tablet   diclofenac 75 MG EC tablet Commonly known as:  VOLTAREN     TAKE these medications   atorvastatin 40 MG tablet Commonly known as:  LIPITOR Take 40 mg by mouth every evening.   HYDROcodone-acetaminophen 5-325 MG tablet Commonly known as:  NORCO/VICODIN Take 1-2 tablets by mouth every 4 (four) hours as needed for moderate pain or severe pain.   methocarbamol 500 MG tablet Commonly known as:  ROBAXIN Take 1 tablet (500 mg total) by  mouth  every 6 (six) hours as needed for muscle spasms.   metoprolol succinate 25 MG 24 hr tablet Commonly known as:  TOPROL-XL Take 25 mg by mouth daily.   nitroGLYCERIN 0.4 MG SL tablet Commonly known as:  NITROSTAT Place 0.4 mg under the tongue every 5 (five) minutes x 2 doses as needed for chest pain.   rivaroxaban 10 MG Tabs tablet Commonly known as:  XARELTO Take 1 tablet (10 mg total) by mouth daily with breakfast. Take Xarelto for two and a half more weeks following discharge from the hospital, then discontinue Xarelto. Once the patient has completed the Xarelto, they may resume the 81 mg Aspirin. Start taking on:  02/24/2018   traMADol 50 MG tablet Commonly known as:  ULTRAM Take 1-2 tablets (50-100 mg total) by mouth every 6 (six) hours as needed (mild pain).            Discharge Care Instructions  (From admission, onward)        Start     Ordered   02/23/18 0000  Weight bearing as tolerated    Question Answer Comment  Laterality right   Extremity Lower      02/23/18 0925   02/23/18 0000  Change dressing    Comments:  You may change your dressing dressing daily with sterile 4 x 4 inch gauze dressing and paper tape.  Do not submerge the incision under water.   02/23/18 5996     Follow-up Information    Gaynelle Arabian, MD. Schedule an appointment as soon as possible for a visit on 03/09/2018.   Specialty:  Orthopedic Surgery Contact information: [address] [place] [address] [phone]           Signed: Arlee Muslim, PA-C Orthopaedic Surgery 02/23/2018, 9:26 AM

## 2018-02-23 NOTE — Progress Notes (Signed)
Physical Therapy Treatment Patient Details Name: Johnny Graves MRN: [id number] DOB: [birth year] Today's Date: 02/23/2018    History of Present Illness s/p R THA    PT Comments    Pt making excellent progress; feels comfortable with d/c today; all questions and concerns regarding mobility addressed; RN  made aware  Follow Up Recommendations  Follow surgeon's recommendation for DC plan and follow-up therapies     Equipment Recommendations  3in1 (PT)    Recommendations for Other Services       Precautions / Restrictions Precautions Precautions: Fall Restrictions Weight Bearing Restrictions: No Other Position/Activity Restrictions: WBAT    Mobility  Bed Mobility               General bed mobility comments: NT  Transfers Overall transfer level: Needs assistance Equipment used: Rolling walker (2 wheeled) Transfers: Sit to/from Stand Sit to Stand: Supervision;Modified independent (Device/Increase time)         General transfer comment: cues for hand placement initially, pt self corrects  Ambulation/Gait Ambulation/Gait assistance: Supervision Ambulation Distance (Feet): 140 Feet Assistive device: Rolling walker (2 wheeled) Gait Pattern/deviations: Step-to pattern;Step-through pattern     General Gait Details: cues for sequence and gait progression   Stairs Stairs: Yes   Stair Management: One rail Right;One rail Left;Step to pattern;Forwards;With cane Number of Stairs: 5 General stair comments: cues for sequence and technique  Wheelchair Mobility    Modified Rankin (Stroke Patients Only)       Balance                                            Cognition Arousal/Alertness: Awake/alert Behavior During Therapy: WFL for tasks assessed/performed Overall Cognitive Status: Within Functional Limits for tasks assessed                                        Exercises Total Joint Exercises Ankle Circles/Pumps:  AROM;Both;10 reps Quad Sets: AROM;Both;10 reps Heel Slides: AAROM;AROM;Right;10 reps Hip ABduction/ADduction: AROM;AAROM;Right;10 reps    General Comments        Pertinent Vitals/Pain Pain Assessment: 0-10 Pain Score: 2  Pain Location: right hip Pain Descriptors / Indicators: Operative site guarding Pain Intervention(s): Limited activity within patient's tolerance;Monitored during session    Home Living                      Prior Function            PT Goals (current goals can now be found in the care plan section) Acute Rehab PT Goals Patient Stated Goal: home today if possible PT Goal Formulation: With patient Time For Goal Achievement: 03/02/18 Potential to Achieve Goals: Fair Progress towards PT goals: Progressing toward goals    Frequency    7X/week      PT Plan Current plan remains appropriate    Co-evaluation              AM-PAC PT "6 Clicks" Daily Activity  Outcome Measure  Difficulty turning over in bed (including adjusting bedclothes, sheets and blankets)?: A Little Difficulty moving from lying on back to sitting on the side of the bed? : A Little Difficulty sitting down on and standing up from a chair with arms (e.g., wheelchair, bedside commode, etc,.)?: A Little Help needed  moving to and from a bed to chair (including a wheelchair)?: A Little Help needed walking in hospital room?: A Little Help needed climbing 3-5 steps with a railing? : A Little 6 Click Score: 18    End of Session Equipment Utilized During Treatment: Gait belt Activity Tolerance: Patient tolerated treatment well Patient left: in chair;with call bell/phone within reach;with chair alarm set;with family/visitor present Nurse Communication: Mobility status PT Visit Diagnosis: Difficulty in walking, not elsewhere classified (R26.2)     Time: 2119-4174 PT Time Calculation (min) (ACUTE ONLY): 26 min  Charges:  $Gait Training: 8-22 mins $Therapeutic Exercise: 8-22  mins                    G CodesKenyon Ana, PT Pager: [phone] 02/23/2018    [facility] 02/23/2018, 3:19 PM

## 2018-05-08 HISTORY — PX: CARDIAC CATHETERIZATION: SHX172

## 2019-11-30 NOTE — Patient Instructions (Addendum)
DUE TO COVID-19 ONLY ONE VISITOR IS ALLOWED TO COME WITH YOU AND STAY IN THE WAITING ROOM ONLY DURING PRE OP AND PROCEDURE DAY OF SURGERY. THE 1 VISITOR MAY VISIT WITH YOU AFTER SURGERY IN YOUR PRIVATE ROOM DURING VISITING HOURS ONLY!  YOU NEED TO HAVE A COVID 19 TEST ON_Saturday 01/02/2021______ @__1110  am_____, THIS TEST MUST BE DONE BEFORE SURGERY, COME  [place], [place] , [address].  ([place]) ONCE YOUR COVID TEST IS COMPLETED, PLEASE BEGIN THE QUARANTINE INSTRUCTIONS AS OUTLINED IN YOUR HANDOUT.                Johnny Graves    Your procedure is scheduled on: Wednesday 12/14/2019   Report to [facility] Main  Entrance    Report to admitting at  100  PM     Call this number if you have problems the morning of surgery [phone]    Remember: Do not eat food  :After Midnight.     NO SOLID FOOD AFTER MIDNIGHT THE NIGHT PRIOR TO SURGERY. NOTHING BY MOUTH EXCEPT CLEAR LIQUIDS UNTIL 1230 pm  .     PLEASE FINISH ENSURE DRINK PER SURGEON ORDER  WHICH NEEDS TO BE COMPLETED AT   1230 pm .   CLEAR LIQUID DIET   Foods Allowed                                                                     Foods Excluded  Coffee and tea, regular and decaf                             liquids that you cannot  Plain Jell-O any favor except red or purple                                           see through such as: Fruit ices (not with fruit pulp)                                     milk, soups, orange juice  Iced Popsicles                                    All solid food Carbonated beverages, regular and diet                                    Cranberry, grape and apple juices Sports drinks like Gatorade Lightly seasoned clear broth or consume(fat free) Sugar, honey syrup  Sample Menu Breakfast                                Lunch  Supper Cranberry juice                    Beef broth                            Chicken  broth Jell-O                                     Grape juice                           Apple juice Coffee or tea                        Jell-O                                      Popsicle                                                Coffee or tea                        Coffee or tea  _____________________________________________________________________     BRUSH YOUR TEETH MORNING OF SURGERY AND RINSE YOUR MOUTH OUT, NO CHEWING GUM CANDY OR MINTS.     Take these medicines the morning of surgery with A SIP OF WATER: Aspirin                                 You may not have any metal on your body including hair pins and              piercings  Do not wear jewelry, make-up, lotions, powders or perfumes, deodorant                         Men may shave face and neck.   Do not bring valuables to the hospital. [place].  Contacts, dentures or bridgework may not be worn into surgery.  Leave suitcase in the car. After surgery it may be brought to your room.                  Please read over the following fact sheets you were given: _____________________________________________________________________             [facility] - Preparing for Surgery Before surgery, you can play an important role.  Because skin is not sterile, your skin needs to be as free of germs as possible.  You can reduce the number of germs on your skin by washing with CHG (chlorahexidine gluconate) soap before surgery.  CHG is an antiseptic cleaner which kills germs and bonds with the skin to continue killing germs even after washing. Please DO NOT use if you have an allergy to CHG or antibacterial soaps.  If your skin becomes reddened/irritated stop using the CHG and inform your nurse when you  arrive at Short Stay. Do not shave (including legs and underarms) for at least 48 hours prior to the first CHG shower.  You may shave your face/neck. Please follow these  instructions carefully:  1.  Shower with CHG Soap the night before surgery and the  morning of Surgery.  2.  If you choose to wash your hair, wash your hair first as usual with your  normal  shampoo.  3.  After you shampoo, rinse your hair and body thoroughly to remove the  shampoo.                           4.  Use CHG as you would any other liquid soap.  You can apply chg directly  to the skin and wash                       Gently with a scrungie or clean washcloth.  5.  Apply the CHG Soap to your body ONLY FROM THE NECK DOWN.   Do not use on face/ open                           Wound or open sores. Avoid contact with eyes, ears mouth and genitals (private parts).                       Wash face,  Genitals (private parts) with your normal soap.             6.  Wash thoroughly, paying special attention to the area where your surgery  will be performed.  7.  Thoroughly rinse your body with warm water from the neck down.  8.  DO NOT shower/wash with your normal soap after using and rinsing off  the CHG Soap.                9.  Pat yourself dry with a clean towel.            10.  Wear clean pajamas.            11.  Place clean sheets on your bed the night of your first shower and do not  sleep with pets. Day of Surgery : Do not apply any lotions/deodorants the morning of surgery.  Please wear clean clothes to the hospital/surgery center.  FAILURE TO FOLLOW THESE INSTRUCTIONS MAY RESULT IN THE CANCELLATION OF YOUR SURGERY PATIENT SIGNATURE_________________________________  NURSE SIGNATURE__________________________________  ________________________________________________________________________   Johnny Graves  An incentive spirometer is a tool that can help keep your lungs clear and active. This tool measures how well you are filling your lungs with each breath. Taking long deep breaths may help reverse or decrease the chance of developing breathing (pulmonary) problems (especially  infection) following:  A long period of time when you are unable to move or be active. BEFORE THE PROCEDURE   If the spirometer includes an indicator to show your best effort, your nurse or respiratory therapist will set it to a desired goal.  If possible, sit up straight or lean slightly forward. Try not to slouch.  Hold the incentive spirometer in an upright position. INSTRUCTIONS FOR USE  1. Sit on the edge of your bed if possible, or sit up as far as you can in bed or on a chair. 2. Hold the incentive spirometer in an upright position. 3. Breathe out normally.  4. Place the mouthpiece in your mouth and seal your lips tightly around it. 5. Breathe in slowly and as deeply as possible, raising the piston or the ball toward the top of the column. 6. Hold your breath for 3-5 seconds or for as long as possible. Allow the piston or ball to fall to the bottom of the column. 7. Remove the mouthpiece from your mouth and breathe out normally. 8. Rest for a few seconds and repeat Steps 1 through 7 at least 10 times every 1-2 hours when you are awake. Take your time and take a few normal breaths between deep breaths. 9. The spirometer may include an indicator to show your best effort. Use the indicator as a goal to work toward during each repetition. 10. After each set of 10 deep breaths, practice coughing to be sure your lungs are clear. If you have an incision (the cut made at the time of surgery), support your incision when coughing by placing a pillow or rolled up towels firmly against it. Once you are able to get out of bed, walk around indoors and cough well. You may stop using the incentive spirometer when instructed by your caregiver.  RISKS AND COMPLICATIONS  Take your time so you do not get dizzy or light-headed.  If you are in pain, you may need to take or ask for pain medication before doing incentive spirometry. It is harder to take a deep breath if you are having pain. AFTER  USE  Rest and breathe slowly and easily.  It can be helpful to keep track of a log of your progress. Your caregiver can provide you with a simple table to help with this. If you are using the spirometer at home, follow these instructions: [place] IF:   You are having difficultly using the spirometer.  You have trouble using the spirometer as often as instructed.  Your pain medication is not giving enough relief while using the spirometer.  You develop fever of 100.5 F (38.1 C) or higher. SEEK IMMEDIATE MEDICAL CARE IF:   You cough up bloody sputum that had not been present before.  You develop fever of 102 F (38.9 C) or greater.  You develop worsening pain at or near the incision site. MAKE SURE YOU:   Understand these instructions.  Will watch your condition.  Will get help right away if you are not doing well or get worse. Document Released: 04/06/2007 Document Revised: 02/16/2012 Document Reviewed: 06/07/2007 ExitCare Patient Information 2014 ExitCare, [place].   ________________________________________________________________________  WHAT IS A BLOOD TRANSFUSION? Blood Transfusion Information  A transfusion is the replacement of blood or some of its parts. Blood is made up of multiple cells which provide different functions.  Red blood cells carry oxygen and are used for blood loss replacement.  White blood cells fight against infection.  Platelets control bleeding.  Plasma helps clot blood.  Other blood products are available for specialized needs, such as hemophilia or other clotting disorders. BEFORE THE TRANSFUSION  Who gives blood for transfusions?   Healthy volunteers who are fully evaluated to make sure their blood is safe. This is blood bank blood. Transfusion therapy is the safest it has ever been in the practice of medicine. Before blood is taken from a donor, a complete history is taken to make sure that person has no history of diseases  nor engages in risky social behavior (examples are intravenous drug use or sexual activity with multiple partners). The donor's travel history  is screened to minimize risk of transmitting infections, such as malaria. The donated blood is tested for signs of infectious diseases, such as HIV and hepatitis. The blood is then tested to be sure it is compatible with you in order to minimize the chance of a transfusion reaction. If you or a relative donates blood, this is often done in anticipation of surgery and is not appropriate for emergency situations. It takes many days to process the donated blood. RISKS AND COMPLICATIONS Although transfusion therapy is very safe and saves many lives, the main dangers of transfusion include:   Getting an infectious disease.  Developing a transfusion reaction. This is an allergic reaction to something in the blood you were given. Every precaution is taken to prevent this. The decision to have a blood transfusion has been considered carefully by your caregiver before blood is given. Blood is not given unless the benefits outweigh the risks. AFTER THE TRANSFUSION  Right after receiving a blood transfusion, you will usually feel much better and more energetic. This is especially true if your red blood cells have gotten low (anemic). The transfusion raises the level of the red blood cells which carry oxygen, and this usually causes an energy increase.  The nurse administering the transfusion will monitor you carefully for complications. HOME CARE INSTRUCTIONS  No special instructions are needed after a transfusion. You may find your energy is better. Speak with your caregiver about any limitations on activity for underlying diseases you may have. SEEK MEDICAL CARE IF:   Your condition is not improving after your transfusion.  You develop redness or irritation at the intravenous (IV) site. SEEK IMMEDIATE MEDICAL CARE IF:  Any of the following symptoms occur over the  next 12 hours:  Shaking chills.  You have a temperature by mouth above 102 F (38.9 C), not controlled by medicine.  Chest, back, or muscle pain.  People around you feel you are not acting correctly or are confused.  Shortness of breath or difficulty breathing.  Dizziness and fainting.  You get a rash or develop hives.  You have a decrease in urine output.  Your urine turns a dark color or changes to pink, red, or brown. Any of the following symptoms occur over the next 10 days:  You have a temperature by mouth above 102 F (38.9 C), not controlled by medicine.  Shortness of breath.  Weakness after normal activity.  The white part of the eye turns yellow (jaundice).  You have a decrease in the amount of urine or are urinating less often.  Your urine turns a dark color or changes to pink, red, or brown. Document Released: 11/21/2000 Document Revised: 02/16/2012 Document Reviewed: 07/10/2008 [facility] Patient Information 2014 [place], [place].  _______________________________________________________________________

## 2019-12-06 ENCOUNTER — Encounter (HOSPITAL_COMMUNITY)
Admission: RE | Admit: 2019-12-06 | Discharge: 2019-12-06 | Disposition: A | Discharge status: Home / Self Care | Payer: Medicare Other | Source: Ambulatory Visit | Attending: Orthopedic Surgery | Admitting: Orthopedic Surgery

## 2019-12-06 ENCOUNTER — Encounter (INDEPENDENT_AMBULATORY_CARE_PROVIDER_SITE_OTHER): Payer: Self-pay

## 2019-12-06 ENCOUNTER — Other Ambulatory Visit: Payer: Self-pay

## 2019-12-06 ENCOUNTER — Encounter (HOSPITAL_COMMUNITY): Payer: Self-pay

## 2019-12-06 DIAGNOSIS — Z01812 Encounter for preprocedural laboratory examination: Secondary | ICD-10-CM | POA: Diagnosis present

## 2019-12-06 HISTORY — DX: Atherosclerotic heart disease of native coronary artery without angina pectoris: I25.10

## 2019-12-06 LAB — COMPREHENSIVE METABOLIC PANEL
ALT: 25 U/L (ref 0–44)
AST: 22 U/L (ref 15–41)
Albumin: 4 g/dL (ref 3.5–5.0)
Alkaline Phosphatase: 72 U/L (ref 38–126)
Anion gap: 7 (ref 5–15)
BUN: 18 mg/dL (ref 8–23)
CO2: 27 mmol/L (ref 22–32)
Calcium: 8.8 mg/dL — ABNORMAL LOW (ref 8.9–10.3)
Chloride: 107 mmol/L (ref 98–111)
Creatinine, Ser: 0.91 mg/dL (ref 0.61–1.24)
GFR calc Af Amer: >60 mL/min (ref >60)
GFR calc non Af Amer: >60 mL/min (ref >60)
Glucose, Bld: 90 mg/dL (ref 70–99)
Potassium: 4.2 mmol/L (ref 3.5–5.1)
Sodium: 141 mmol/L (ref 135–145)
Total Bilirubin: 1.1 mg/dL (ref 0.3–1.2)
Total Protein: 6.4 g/dL — ABNORMAL LOW (ref 6.5–8.1)

## 2019-12-06 LAB — CBC
HCT: 38.8 % — ABNORMAL LOW (ref 39.0–52.0)
Hemoglobin: 12.8 g/dL — ABNORMAL LOW (ref 13.0–17.0)
MCH: 32.7 pg (ref 26.0–34.0)
MCHC: 33 g/dL (ref 30.0–36.0)
MCV: 99 fL (ref 80.0–100.0)
Platelets: 241 10*3/uL (ref 150–400)
RBC: 3.92 MIL/uL — ABNORMAL LOW (ref 4.22–5.81)
RDW: 12.9 % (ref 11.5–15.5)
WBC: 6.7 10*3/uL (ref 4.0–10.5)
nRBC: 0 % (ref 0.0–0.2)

## 2019-12-06 LAB — APTT: aPTT: 30 seconds (ref 24–36)

## 2019-12-06 LAB — SURGICAL PCR SCREEN
MRSA, PCR: NEGATIVE
Staphylococcus aureus: NEGATIVE

## 2019-12-06 LAB — PROTIME-INR
INR: 1 (ref 0.8–1.2)
Prothrombin Time: 13.1 seconds (ref 11.4–15.2)

## 2019-12-06 NOTE — Progress Notes (Addendum)
PCP - Dr. Kennieth Rad  [place]  LOV 01/2018 Cardiologist - Dr. Orpah Greek [facility] 05/09/2019 10/24/2019-Cardiac clearance on chart from Dr. Orpah Greek  Chest x-ray - n/a EKG - 05/09/2019 on chart from Dr. Orpah Greek Stress Test - 02/01/2018 on chart from Dr. Orpah Greek ECHO - n/a Cardiac Cath - 05/28/2018-with 2 stents done at [facility] chart, 12/29/2017 at [facility] on chart 05/18/2018-H&P on chart from Dr. Orpah Greek on chart  Sleep Study - n/a CPAP - n/a  Fasting Blood Sugar - n/a Checks Blood Sugar ___0__ times a day  Blood Thinner Instructions:Brlinta to stop 5 days prior to surgery instructed by Dr. Orpah Greek Aspirin Instructions:Aspirin 81 mg  Patient states that Dr. Sabra Heck informed Dr. Wynelle Link for patient to stay on the Aspirin for surgery. Last Dose:instructed to not stop Aspirin 81 mg  Anesthesia review:  Chart given to Konrad Felix, PA to review chart for patient's history.  Patient informed me that his wife had a scratchy throat and a fever yesterday and had a Covid Test done in [place], [place]. And the test was positive.  Patient denies shortness of breath, fever, cough and chest pain at PAT appointment   Patient verbalized understanding of instructions that were given to them at the PAT appointment. Patient was also instructed that they will need to review over the PAT instructions again at home before surgery.

## 2019-12-08 NOTE — Progress Notes (Signed)
Anesthesia Chart Review   Case: [id number] Date/Time: 12/14/19 1135   Procedure: TOTAL HIP ARTHROPLASTY ANTERIOR APPROACH (Left Hip) - 174min   Anesthesia type: Choice   Pre-op diagnosis: left hip osteoarthritis   Location: [facility] ROOM 10 / [facility]   Surgeons: Gaynelle Arabian, MD      DISCUSSION:68 y.o. never smoker with h/o CAD (stents x 2 2019), left hip OA scheduled for above procedure 12/14/2019 with Dr. Gaynelle Arabian.   Pt cleared by cardiologist, Dr. Orpah Greek, clearance on chart.  Per note pt is moderate risk due to CAD with stent placement x 2 in 2019.  Advised to hold Brillinta 5 days prior to surgery.  LOV on chart.   VS: BP (!) 161/74 (BP Location: Left Arm)   Pulse (!) 52   Temp 36.6 C (Oral)   Resp 16   Ht 6' (1.829 m)   Wt 76.8 kg   SpO2 100%   BMI 22.96 kg/m   PROVIDERS: Orpah Greek, MD is Cardiologist    LABS: Labs reviewed: Acceptable for surgery. (all labs ordered are listed, but only abnormal results are displayed)  Labs Reviewed  CBC - Abnormal; Notable for the following components:      Result Value   RBC 3.92 (*)    Hemoglobin 12.8 (*)    HCT 38.8 (*)    All other components within normal limits  COMPREHENSIVE METABOLIC PANEL - Abnormal; Notable for the following components:   Calcium 8.8 (*)    Total Protein 6.4 (*)    All other components within normal limits  SURGICAL PCR SCREEN  APTT  PROTIME-INR  TYPE AND SCREEN     IMAGES:   EKG: On chart  CV:  Past Medical History:  Diagnosis Date  . Arthritis   . Cancer (HCC)    Skin-Squamous Cell  . Coronary artery disease     Past Surgical History:  Procedure Laterality Date  . CARDIAC CATHETERIZATION  12/29/2017   Uneventful  . CARDIAC CATHETERIZATION  05/2018   2 stents  . JOINT REPLACEMENT  02/2018   right total hip replacement  . TOTAL HIP ARTHROPLASTY Right 02/22/2018   Procedure: RIGHT TOTAL HIP ARTHROPLASTY ANTERIOR APPROACH;  Surgeon: Gaynelle Arabian, MD;  Location: [facility];   Service: Orthopedics;  Laterality: Right;    MEDICATIONS: . aspirin EC 81 MG tablet  . atorvastatin (LIPITOR) 40 MG tablet  . diclofenac (VOLTAREN) 75 MG EC tablet  . nitroGLYCERIN (NITROSTAT) 0.4 MG SL tablet  . ticagrelor (BRILINTA) 60 MG TABS tablet   No current facility-administered medications for this encounter.    Maia Plan [facility] Pre-Surgical Testing [phone] 12/13/19 2:08 PM

## 2019-12-08 NOTE — H&P (Signed)
TOTAL HIP ADMISSION H&P  Patient is admitted for left total hip arthroplasty.  Subjective:  Chief Complaint: left hip pain  HPI: Johnny Graves, 68 y.o. male, has a history of pain and functional disability in the left hip(s) due to arthritis and patient has failed non-surgical conservative treatments for greater than 12 weeks to include use of assistive devices and activity modification.  Onset of symptoms was gradual starting 1 years ago with gradually worsening course since that time.The patient noted no past surgery on the left hip(s).  Patient currently rates pain in the left hip at 7 out of 10 with activity. Patient has worsening of pain with activity and weight bearing, pain that interfers with activities of daily living and crepitus. Patient has evidence of severe end-stage osteoarthritis of the left hip with bone-on-bone change and subchondral cystic formation by imaging studies. This condition presents safety issues increasing the risk of falls. There is no current active infection.  Patient Active Problem List   Diagnosis Date Noted  . OA (osteoarthritis) of hip 02/22/2018   Past Medical History:  Diagnosis Date  . Arthritis   . Cancer (HCC)    Skin-Squamous Cell  . Coronary artery disease     Past Surgical History:  Procedure Laterality Date  . CARDIAC CATHETERIZATION  12/29/2017   Uneventful  . CARDIAC CATHETERIZATION  05/2018   2 stents  . JOINT REPLACEMENT  02/2018   right total hip replacement  . TOTAL HIP ARTHROPLASTY Right 02/22/2018   Procedure: RIGHT TOTAL HIP ARTHROPLASTY ANTERIOR APPROACH;  Surgeon: Gaynelle Arabian, MD;  Location[facility];  Service: Orthopedics;  Laterality: Right;    No current facility-administered medications for this encounter.   Current Outpatient Medications  Medication Sig Dispense Refill Last Dose  . aspirin EC 81 MG tablet Take 81 mg by mouth daily.     Marland Kitchen atorvastatin (LIPITOR) 40 MG tablet Take 40 mg by mouth every evening.  12   .  diclofenac (VOLTAREN) 75 MG EC tablet Take 75 mg by mouth 2 (two) times daily.     . nitroGLYCERIN (NITROSTAT) 0.4 MG SL tablet Place 0.4 mg under the tongue every 5 (five) minutes x 2 doses as needed for chest pain.  2   . ticagrelor (BRILINTA) 60 MG TABS tablet Take 60 mg by mouth 2 (two) times daily.      No Known Allergies  Social History   Tobacco Use  . Smoking status: Never Smoker  . Smokeless tobacco: Former Network engineer Use Topics  . Alcohol use: No    No family history on file.   Review of Systems  Constitutional: Negative for chills and fever.  HENT: Negative for congestion, sore throat and tinnitus.   Eyes: Negative for photophobia and pain.  Respiratory: Negative for cough, shortness of breath and wheezing.   Cardiovascular: Negative for chest pain and palpitations.  Gastrointestinal: Negative for nausea and vomiting.  Genitourinary: Negative for dysuria, frequency and urgency.  Neurological: Negative for dizziness, weakness and headaches.    Objective:  Physical Exam  Well nourished and well developed.  General: Alert and oriented x3, cooperative and pleasant, no acute distress.  Head: normocephalic, atraumatic, neck supple.  Eyes: EOMI.  Respiratory: breath sounds clear in all fields, no wheezing, rales, or rhonchi. Cardiovascular: Regular rate and rhythm, no murmurs, gallops or rubs.  Abdomen: non-tender to palpation and soft, normoactive bowel sounds. Musculoskeletal:  Left Hip Exam: The range of motion: Flexion to 100 degrees, Internal Rotation to  0 degrees, External Rotation to 0 degrees, and abduction to 20 degrees.  There is no tenderness over the greater trochanteric bursa.   Calves soft and nontender. Motor function intact in LE. Strength 5/5 LE bilaterally. Neuro: Distal pulses 2+. Sensation to light touch intact in LE.   Vital signs in last 24 hours:  Blood pressure: 140/86 mmHg Pulse: 52 bpm  Labs:   Estimated body mass index is 22.96  kg/m as calculated from the following:   Height as of 12/06/19: 6' (1.829 m).   Weight as of 12/06/19: 76.8 kg.   Imaging Review Plain radiographs demonstrate severe degenerative joint disease of the left hip(s). The bone quality appears to be adequate for age and reported activity level.   Assessment/Plan:  End stage arthritis, left hip(s)  The patient history, physical examination, clinical judgement of the provider and imaging studies are consistent with end stage degenerative joint disease of the left hip(s) and total hip arthroplasty is deemed medically necessary. The treatment options including medical management, injection therapy, arthroscopy and arthroplasty were discussed at length. The risks and benefits of total hip arthroplasty were presented and reviewed. The risks due to aseptic loosening, infection, stiffness, dislocation/subluxation,  thromboembolic complications and other imponderables were discussed.  The patient acknowledged the explanation, agreed to proceed with the plan and consent was signed. Patient is being admitted for inpatient treatment for surgery, pain control, PT, OT, prophylactic antibiotics, VTE prophylaxis, progressive ambulation and ADL's and discharge planning.The patient is planning to be discharged home.   Anticipated LOS equal to or greater than 2 midnights due to - Age 54 and older with one or more of the following:  - Obesity  - Expected need for hospital services (PT, OT, Nursing) required for safe  discharge  - Anticipated need for postoperative skilled nursing care or inpatient rehab  - Active co-morbidities: Coronary Artery Disease OR   - Unanticipated findings during/Post Surgery: None  - Patient is a high risk of re-admission due to: None   Therapy Plans: HEP Disposition: Home with wife Planned DVT Prophylaxis: Brilinta and aspirin (hx CAD with stent placement in 2019) DME needed: None PCP: Orpah Greek, MD TXA: IV Allergies: NKDA  Anesthesia Concerns: None BMI: 23.1  Other: Pt had hiccups following last surgery - wants prescription for chlorpromazine 10 mg BID PRN x 7 days  - Patient was instructed on what medications to stop prior to surgery. - Follow-up visit in 2 weeks with Dr. Wynelle Link - Begin physical therapy following surgery - Pre-operative lab work as pre-surgical testing - Prescriptions will be provided in hospital at time of discharge  Theresa Duty, PA-C Orthopedic Surgery [facility] [place]

## 2019-12-10 ENCOUNTER — Other Ambulatory Visit (HOSPITAL_COMMUNITY)
Admission: RE | Admit: 2019-12-10 | Discharge: 2019-12-10 | Disposition: A | Discharge status: Home / Self Care | Payer: Medicare Other | Source: Ambulatory Visit | Attending: Orthopedic Surgery | Admitting: Orthopedic Surgery

## 2019-12-10 DIAGNOSIS — Z20822 Contact with and (suspected) exposure to covid-19: Secondary | ICD-10-CM | POA: Diagnosis not present

## 2019-12-10 DIAGNOSIS — Z01812 Encounter for preprocedural laboratory examination: Secondary | ICD-10-CM | POA: Insufficient documentation

## 2019-12-12 LAB — NOVEL CORONAVIRUS, NAA (HOSP ORDER, SEND-OUT TO REF LAB; TAT 18-24 HRS): SARS-CoV-2, NAA: NOT DETECTED

## 2019-12-13 NOTE — Progress Notes (Signed)
Called patient about time change for 12/14/2019 surgery. He is to arrive 0920 for 1150 surgery. ERAS drink to be finished by 0850 AM.  Patient is just leaving his cardiologist office , Dr Orpah Greek. Patient is upset that he was asked to get another cardiac clearance despite the fact there is cardiac clearance from 11/20 on chart. New cardiac clearance is being faxed to Dr Aluisio's office.

## 2019-12-13 NOTE — Progress Notes (Signed)
12/13/2019-Cardiac Clearance on chart from Dr. Orpah Greek on chart and last office note on chart.

## 2019-12-14 ENCOUNTER — Inpatient Hospital Stay (HOSPITAL_COMMUNITY)
Admission: RE | Admit: 2019-12-14 | Discharge: 2019-12-14 | DRG: 470 | Disposition: A | Discharge status: Home / Self Care | Payer: Medicare Other | Attending: Orthopedic Surgery | Admitting: Orthopedic Surgery

## 2019-12-14 ENCOUNTER — Encounter (HOSPITAL_COMMUNITY): Payer: Self-pay | Admitting: Orthopedic Surgery

## 2019-12-14 ENCOUNTER — Encounter (HOSPITAL_COMMUNITY)
Admission: RE | Disposition: A | Discharge status: Home / Self Care | Payer: Self-pay | Source: Home / Self Care | Attending: Orthopedic Surgery

## 2019-12-14 ENCOUNTER — Inpatient Hospital Stay (HOSPITAL_COMMUNITY): Payer: Medicare Other

## 2019-12-14 ENCOUNTER — Inpatient Hospital Stay (HOSPITAL_COMMUNITY): Payer: Medicare Other | Admitting: Anesthesiology

## 2019-12-14 ENCOUNTER — Inpatient Hospital Stay (HOSPITAL_COMMUNITY): Payer: Medicare Other | Admitting: Physician Assistant

## 2019-12-14 ENCOUNTER — Other Ambulatory Visit: Payer: Self-pay

## 2019-12-14 DIAGNOSIS — Z7902 Long term (current) use of antithrombotics/antiplatelets: Secondary | ICD-10-CM | POA: Diagnosis not present

## 2019-12-14 DIAGNOSIS — Z7982 Long term (current) use of aspirin: Secondary | ICD-10-CM

## 2019-12-14 DIAGNOSIS — I2510 Atherosclerotic heart disease of native coronary artery without angina pectoris: Secondary | ICD-10-CM | POA: Diagnosis present

## 2019-12-14 DIAGNOSIS — Z955 Presence of coronary angioplasty implant and graft: Secondary | ICD-10-CM

## 2019-12-14 DIAGNOSIS — Z79899 Other long term (current) drug therapy: Secondary | ICD-10-CM | POA: Diagnosis not present

## 2019-12-14 DIAGNOSIS — Z96649 Presence of unspecified artificial hip joint: Secondary | ICD-10-CM

## 2019-12-14 DIAGNOSIS — M1612 Unilateral primary osteoarthritis, left hip: Principal | ICD-10-CM | POA: Diagnosis present

## 2019-12-14 DIAGNOSIS — M25752 Osteophyte, left hip: Secondary | ICD-10-CM | POA: Diagnosis present

## 2019-12-14 DIAGNOSIS — Z96642 Presence of left artificial hip joint: Secondary | ICD-10-CM

## 2019-12-14 DIAGNOSIS — Z87891 Personal history of nicotine dependence: Secondary | ICD-10-CM

## 2019-12-14 DIAGNOSIS — Z85828 Personal history of other malignant neoplasm of skin: Secondary | ICD-10-CM

## 2019-12-14 DIAGNOSIS — Z96641 Presence of right artificial hip joint: Secondary | ICD-10-CM | POA: Diagnosis present

## 2019-12-14 DIAGNOSIS — Z419 Encounter for procedure for purposes other than remedying health state, unspecified: Secondary | ICD-10-CM

## 2019-12-14 HISTORY — PX: TOTAL HIP ARTHROPLASTY: SHX124

## 2019-12-14 LAB — TYPE AND SCREEN
ABO/RH(D): O POS
Antibody Screen: NEGATIVE

## 2019-12-14 SURGERY — ARTHROPLASTY, HIP, TOTAL, ANTERIOR APPROACH
Anesthesia: Spinal | Site: Hip | Laterality: Left

## 2019-12-14 MED ORDER — ACETAMINOPHEN 325 MG PO TABS: 325.0000 mg | ORAL_TABLET | Freq: Four times a day (QID) | ORAL | Status: DC | PRN

## 2019-12-14 MED ORDER — POLYETHYLENE GLYCOL 3350 17 G PO PACK: 17.0000 g | PACK | Freq: Every day | ORAL | Status: DC | PRN

## 2019-12-14 MED ORDER — 0.9 % SODIUM CHLORIDE (POUR BTL) OPTIME
TOPICAL | Status: DC | PRN
  Administered 2019-12-14: 1000 mL

## 2019-12-14 MED ORDER — LACTATED RINGERS IV SOLN: INTRAVENOUS | Status: DC

## 2019-12-14 MED ORDER — HYDROCODONE-ACETAMINOPHEN 5-325 MG PO TABS: 1.0000 | ORAL_TABLET | Freq: Four times a day (QID) | ORAL | 0 refills | Status: AC | PRN

## 2019-12-14 MED ORDER — EPHEDRINE 5 MG/ML INJ
INTRAVENOUS | Status: AC
  Filled 2019-12-14: qty 10

## 2019-12-14 MED ORDER — LACTATED RINGERS IV BOLUS
500.0000 mL | Freq: Once | INTRAVENOUS | Status: AC
  Administered 2019-12-14: 13:00:00 500 mL via INTRAVENOUS

## 2019-12-14 MED ORDER — BUPIVACAINE HCL 0.25 % IJ SOLN
INTRAMUSCULAR | Status: DC | PRN
  Administered 2019-12-14: 30 mL via INTRA_ARTICULAR

## 2019-12-14 MED ORDER — BUPIVACAINE HCL (PF) 0.25 % IJ SOLN
INTRAMUSCULAR | Status: AC
  Filled 2019-12-14: qty 30

## 2019-12-14 MED ORDER — CHLORPROMAZINE HCL 10 MG PO TABS: 10.0000 mg | ORAL_TABLET | Freq: Two times a day (BID) | ORAL | 0 refills | Status: AC

## 2019-12-14 MED ORDER — LACTATED RINGERS IV BOLUS: 250.0000 mL | Freq: Once | INTRAVENOUS | Status: DC

## 2019-12-14 MED ORDER — PROPOFOL 10 MG/ML IV BOLUS
INTRAVENOUS | Status: AC
  Filled 2019-12-14: qty 20

## 2019-12-14 MED ORDER — EPHEDRINE SULFATE-NACL 50-0.9 MG/10ML-% IV SOSY
PREFILLED_SYRINGE | INTRAVENOUS | Status: DC | PRN
  Administered 2019-12-14: 10 mg via INTRAVENOUS

## 2019-12-14 MED ORDER — TRAMADOL HCL 50 MG PO TABS: 50.0000 mg | ORAL_TABLET | Freq: Four times a day (QID) | ORAL | 0 refills | Status: AC | PRN

## 2019-12-14 MED ORDER — CEFAZOLIN SODIUM-DEXTROSE 2-4 GM/100ML-% IV SOLN
2.0000 g | INTRAVENOUS | Status: AC
  Administered 2019-12-14: 2 g via INTRAVENOUS
  Filled 2019-12-14: qty 100

## 2019-12-14 MED ORDER — HYDROCODONE-ACETAMINOPHEN 7.5-325 MG PO TABS: 1.0000 | ORAL_TABLET | ORAL | Status: DC | PRN

## 2019-12-14 MED ORDER — METOCLOPRAMIDE HCL 5 MG PO TABS
5.0000 mg | ORAL_TABLET | Freq: Three times a day (TID) | ORAL | Status: DC | PRN
  Filled 2019-12-14: qty 2

## 2019-12-14 MED ORDER — EPHEDRINE SULFATE 50 MG/ML IJ SOLN
INTRAMUSCULAR | Status: DC | PRN
  Administered 2019-12-14 (×2): 10 mg via INTRAVENOUS
  Administered 2019-12-14: 15 mg via INTRAVENOUS

## 2019-12-14 MED ORDER — BISACODYL 10 MG RE SUPP: 10.0000 mg | Freq: Every day | RECTAL | Status: DC | PRN

## 2019-12-14 MED ORDER — DIPHENHYDRAMINE HCL 12.5 MG/5ML PO ELIX
12.5000 mg | ORAL_SOLUTION | ORAL | Status: DC | PRN
  Filled 2019-12-14: qty 10

## 2019-12-14 MED ORDER — ASPIRIN EC 325 MG PO TBEC: 325.0000 mg | DELAYED_RELEASE_TABLET | Freq: Every day | ORAL | 0 refills | Status: AC

## 2019-12-14 MED ORDER — METOCLOPRAMIDE HCL 5 MG/ML IJ SOLN: 5.0000 mg | Freq: Three times a day (TID) | INTRAMUSCULAR | Status: DC | PRN

## 2019-12-14 MED ORDER — HYDROCODONE-ACETAMINOPHEN 5-325 MG PO TABS: 1.0000 | ORAL_TABLET | ORAL | Status: DC | PRN

## 2019-12-14 MED ORDER — WATER FOR IRRIGATION, STERILE IR SOLN
Status: DC | PRN
  Administered 2019-12-14: 2000 mL

## 2019-12-14 MED ORDER — MEPIVACAINE HCL (PF) 2 % IJ SOLN
INTRAMUSCULAR | Status: DC | PRN
  Administered 2019-12-14: 3.5 mL via INTRATHECAL

## 2019-12-14 MED ORDER — ONDANSETRON HCL 4 MG/2ML IJ SOLN: 4.0000 mg | Freq: Four times a day (QID) | INTRAMUSCULAR | Status: DC | PRN

## 2019-12-14 MED ORDER — MAGNESIUM CITRATE PO SOLN: 1.0000 | Freq: Once | ORAL | Status: DC | PRN

## 2019-12-14 MED ORDER — TICAGRELOR 60 MG PO TABS: 60.0000 mg | ORAL_TABLET | Freq: Two times a day (BID) | ORAL | Status: DC

## 2019-12-14 MED ORDER — METHOCARBAMOL 500 MG PO TABS: 500.0000 mg | ORAL_TABLET | Freq: Four times a day (QID) | ORAL | 0 refills | Status: AC | PRN

## 2019-12-14 MED ORDER — FENTANYL CITRATE (PF) 100 MCG/2ML IJ SOLN
INTRAMUSCULAR | Status: DC | PRN
  Administered 2019-12-14: 100 ug via INTRAVENOUS

## 2019-12-14 MED ORDER — METHOCARBAMOL 500 MG PO TABS: 500.0000 mg | ORAL_TABLET | Freq: Four times a day (QID) | ORAL | Status: DC | PRN

## 2019-12-14 MED ORDER — ONDANSETRON HCL 4 MG PO TABS
4.0000 mg | ORAL_TABLET | Freq: Four times a day (QID) | ORAL | Status: DC | PRN
  Filled 2019-12-14: qty 1

## 2019-12-14 MED ORDER — CEFAZOLIN SODIUM-DEXTROSE 2-4 GM/100ML-% IV SOLN: 2.0000 g | Freq: Four times a day (QID) | INTRAVENOUS | Status: DC

## 2019-12-14 MED ORDER — MENTHOL 3 MG MT LOZG: 1.0000 | LOZENGE | OROMUCOSAL | Status: DC | PRN

## 2019-12-14 MED ORDER — MEPIVACAINE HCL (PF) 2 % IJ SOLN
INTRAMUSCULAR | Status: AC
  Filled 2019-12-14: qty 20

## 2019-12-14 MED ORDER — DOCUSATE SODIUM 100 MG PO CAPS: 100.0000 mg | ORAL_CAPSULE | Freq: Two times a day (BID) | ORAL | Status: DC

## 2019-12-14 MED ORDER — ACETAMINOPHEN 10 MG/ML IV SOLN
1000.0000 mg | Freq: Four times a day (QID) | INTRAVENOUS | Status: DC
  Administered 2019-12-14: 1000 mg via INTRAVENOUS
  Filled 2019-12-14: qty 100

## 2019-12-14 MED ORDER — FENTANYL CITRATE (PF) 100 MCG/2ML IJ SOLN
INTRAMUSCULAR | Status: AC
  Filled 2019-12-14: qty 2

## 2019-12-14 MED ORDER — ONDANSETRON HCL 4 MG/2ML IJ SOLN
INTRAMUSCULAR | Status: DC | PRN
  Administered 2019-12-14: 4 mg via INTRAVENOUS

## 2019-12-14 MED ORDER — TRANEXAMIC ACID-NACL 1000-0.7 MG/100ML-% IV SOLN
1000.0000 mg | INTRAVENOUS | Status: AC
  Administered 2019-12-14: 1000 mg via INTRAVENOUS
  Filled 2019-12-14: qty 100

## 2019-12-14 MED ORDER — CHLORHEXIDINE GLUCONATE 4 % EX LIQD: 60.0000 mL | Freq: Once | CUTANEOUS | Status: DC

## 2019-12-14 MED ORDER — ASPIRIN EC 325 MG PO TBEC: 325.0000 mg | DELAYED_RELEASE_TABLET | Freq: Every day | ORAL | Status: DC

## 2019-12-14 MED ORDER — PROPOFOL 500 MG/50ML IV EMUL
INTRAVENOUS | Status: DC | PRN
  Administered 2019-12-14: 100 ug/kg/min via INTRAVENOUS

## 2019-12-14 MED ORDER — PROPOFOL 10 MG/ML IV BOLUS
INTRAVENOUS | Status: AC
  Filled 2019-12-14: qty 60

## 2019-12-14 MED ORDER — PHENOL 1.4 % MT LIQD: 1.0000 | OROMUCOSAL | Status: DC | PRN

## 2019-12-14 MED ORDER — METHOCARBAMOL 500 MG IVPB - SIMPLE MED: 500.0000 mg | Freq: Four times a day (QID) | INTRAVENOUS | Status: DC | PRN

## 2019-12-14 MED ORDER — POVIDONE-IODINE 10 % EX SWAB
2.0000 "application " | Freq: Once | CUTANEOUS | Status: AC
  Administered 2019-12-14: 2 via TOPICAL

## 2019-12-14 MED ORDER — DEXAMETHASONE SODIUM PHOSPHATE 10 MG/ML IJ SOLN
8.0000 mg | Freq: Once | INTRAMUSCULAR | Status: AC
  Administered 2019-12-14: 11:00:00 8 mg via INTRAVENOUS

## 2019-12-14 MED ORDER — TRANEXAMIC ACID-NACL 1000-0.7 MG/100ML-% IV SOLN: 1000.0000 mg | Freq: Once | INTRAVENOUS | Status: DC

## 2019-12-14 MED ORDER — SODIUM CHLORIDE 0.9 % IV SOLN: INTRAVENOUS | Status: DC

## 2019-12-14 MED ORDER — ASPIRIN EC 81 MG PO TBEC: 81.0000 mg | DELAYED_RELEASE_TABLET | Freq: Every day | ORAL | Status: DC

## 2019-12-14 MED ORDER — ONDANSETRON HCL 4 MG/2ML IJ SOLN: 4.0000 mg | Freq: Once | INTRAMUSCULAR | Status: DC | PRN

## 2019-12-14 MED ORDER — ATORVASTATIN CALCIUM 40 MG PO TABS: 40.0000 mg | ORAL_TABLET | Freq: Every evening | ORAL | Status: DC

## 2019-12-14 MED ORDER — FENTANYL CITRATE (PF) 100 MCG/2ML IJ SOLN: 25.0000 ug | INTRAMUSCULAR | Status: DC | PRN

## 2019-12-14 MED ORDER — NITROGLYCERIN 0.4 MG SL SUBL: 0.4000 mg | SUBLINGUAL_TABLET | SUBLINGUAL | Status: DC | PRN

## 2019-12-14 MED ORDER — MORPHINE SULFATE (PF) 4 MG/ML IV SOLN: 0.5000 mg | INTRAVENOUS | Status: DC | PRN

## 2019-12-14 SURGICAL SUPPLY — 48 items
BAG DECANTER FOR FLEXI CONT (MISCELLANEOUS) IMPLANT
BAG ZIPLOCK 12X15 (MISCELLANEOUS) IMPLANT
BLADE SAG 18X100X1.27 (BLADE) ×3 IMPLANT
CLOSURE WOUND 1/2 X4 (GAUZE/BANDAGES/DRESSINGS) ×2
COVER PERINEAL POST (MISCELLANEOUS) ×3 IMPLANT
COVER SURGICAL LIGHT HANDLE (MISCELLANEOUS) ×3 IMPLANT
COVER WAND RF STERILE (DRAPES) IMPLANT
CUP ACETBLR 54 OD PINNACLE (Hips) ×2 IMPLANT
DECANTER SPIKE VIAL GLASS SM (MISCELLANEOUS) ×3 IMPLANT
DRAPE STERI IOBAN 125X83 (DRAPES) ×3 IMPLANT
DRAPE U-SHAPE 47X51 STRL (DRAPES) ×6 IMPLANT
DRSG ADAPTIC 3X8 NADH LF (GAUZE/BANDAGES/DRESSINGS) ×3 IMPLANT
DRSG AQUACEL AG ADV 3.5X10 (GAUZE/BANDAGES/DRESSINGS) ×2 IMPLANT
DRSG MEPILEX BORDER 4X4 (GAUZE/BANDAGES/DRESSINGS) ×3 IMPLANT
DRSG MEPILEX BORDER 4X8 (GAUZE/BANDAGES/DRESSINGS) ×3 IMPLANT
DURAPREP 26ML APPLICATOR (WOUND CARE) ×3 IMPLANT
ELECT REM PT RETURN 15FT ADLT (MISCELLANEOUS) ×3 IMPLANT
EVACUATOR 1/8 PVC DRAIN (DRAIN) ×3 IMPLANT
GLOVE BIO SURGEON STRL SZ 6 (GLOVE) IMPLANT
GLOVE BIO SURGEON STRL SZ7 (GLOVE) ×2 IMPLANT
GLOVE BIO SURGEON STRL SZ8 (GLOVE) ×3 IMPLANT
GLOVE BIOGEL PI IND STRL 6.5 (GLOVE) IMPLANT
GLOVE BIOGEL PI IND STRL 7.0 (GLOVE) IMPLANT
GLOVE BIOGEL PI IND STRL 8 (GLOVE) ×1 IMPLANT
GLOVE BIOGEL PI INDICATOR 6.5 (GLOVE)
GLOVE BIOGEL PI INDICATOR 7.0 (GLOVE) ×2
GLOVE BIOGEL PI INDICATOR 8 (GLOVE) ×2
GOWN STRL REUS W/TWL LRG LVL3 (GOWN DISPOSABLE) ×3 IMPLANT
GOWN STRL REUS W/TWL XL LVL3 (GOWN DISPOSABLE) IMPLANT
HEAD CERAMIC DELTA 36 PLUS 1.5 (Hips) ×2 IMPLANT
HOLDER FOLEY CATH W/STRAP (MISCELLANEOUS) ×1 IMPLANT
KIT TURNOVER KIT A (KITS) IMPLANT
LINER MARATHON NEUT +4X54X36 (Hips) ×2 IMPLANT
MANIFOLD NEPTUNE II (INSTRUMENTS) ×3 IMPLANT
PACK ANTERIOR HIP CUSTOM (KITS) ×3 IMPLANT
PENCIL SMOKE EVACUATOR COATED (MISCELLANEOUS) ×3 IMPLANT
STEM FEM ACTIS HIGH SZ7 (Stem) ×2 IMPLANT
STRIP CLOSURE SKIN 1/2X4 (GAUZE/BANDAGES/DRESSINGS) ×3 IMPLANT
SUT ETHIBOND NAB CT1 #1 30IN (SUTURE) ×3 IMPLANT
SUT MNCRL AB 4-0 PS2 18 (SUTURE) ×3 IMPLANT
SUT STRATAFIX 0 PDS 27 VIOLET (SUTURE) ×3
SUT VIC AB 2-0 CT1 27 (SUTURE) ×4
SUT VIC AB 2-0 CT1 TAPERPNT 27 (SUTURE) ×2 IMPLANT
SUTURE STRATFX 0 PDS 27 VIOLET (SUTURE) ×1 IMPLANT
SYR 50ML LL SCALE MARK (SYRINGE) IMPLANT
TRAY CATH 16FR W/PLASTIC CATH (SET/KITS/TRAYS/PACK) ×2 IMPLANT
TRAY FOLEY MTR SLVR 16FR STAT (SET/KITS/TRAYS/PACK) ×3 IMPLANT
YANKAUER SUCT BULB TIP 10FT TU (MISCELLANEOUS) ×3 IMPLANT

## 2019-12-14 NOTE — Interval H&P Note (Signed)
History and Physical Interval Note:  12/14/2019 9:37 AM  Johnny Graves  has presented today for surgery, with the diagnosis of left hip osteoarthritis.  The various methods of treatment have been discussed with the patient and family. After consideration of risks, benefits and other options for treatment, the patient has consented to  Procedure(s) with comments: [place] (Left) - 117min as a surgical intervention.  The patient's history has been reviewed, patient examined, no change in status, stable for surgery.  I have reviewed the patient's chart and labs.  Questions were answered to the patient's satisfaction.     Pilar Plate Imani Fiebelkorn

## 2019-12-14 NOTE — Anesthesia Postprocedure Evaluation (Signed)
Anesthesia Post Note  Patient: Johnny Graves  Procedure(s) Performed: TOTAL HIP ARTHROPLASTY ANTERIOR APPROACH (Left Hip)     Patient location during evaluation: PACU Anesthesia Type: Spinal Level of consciousness: oriented, awake and alert and awake Pain management: pain level controlled Vital Signs Assessment: post-procedure vital signs reviewed and stable Respiratory status: spontaneous breathing, respiratory function stable and nonlabored ventilation Cardiovascular status: blood pressure returned to baseline and stable Postop Assessment: no headache, no backache, no apparent nausea or vomiting, patient able to bend at knees and spinal receding Anesthetic complications: no    Last Vitals:  Vitals:   12/14/19 1330 12/14/19 1354  BP: 137/69 133/70  Pulse: (!) 57 (!) 58  Resp: (!) 24 14  Temp: (!) 36 C (!) 36 C  SpO2: 100% 100%    Last Pain:  Vitals:   12/14/19 1330  TempSrc:   PainSc: 0-No pain                 Catalina Gravel

## 2019-12-14 NOTE — Anesthesia Preprocedure Evaluation (Addendum)
Anesthesia Evaluation  Patient identified by MRN, date of birth, ID band Patient awake    Reviewed: Allergy & Precautions, NPO status , Patient's Chart, lab work & pertinent test results  Airway Mallampati: II  TM Distance: >3 FB Neck ROM: Full    Dental  (+) Dental Advisory Given, Missing   Pulmonary neg pulmonary ROS,    Pulmonary exam normal breath sounds clear to auscultation       Cardiovascular (-) angina+ CAD and + Cardiac Stents (05/2018)  (-) Past MI Normal cardiovascular exam Rhythm:Regular Rate:Normal     Neuro/Psych negative neurological ROS  negative psych ROS   GI/Hepatic negative GI ROS, Neg liver ROS,   Endo/Other  negative endocrine ROS  Renal/GU negative Renal ROS     Musculoskeletal  (+) Arthritis , left hip osteoarthritis    Abdominal   Peds  Hematology  (+) Blood dyscrasia, anemia , Plt 241k    Anesthesia Other Findings Day of surgery medications reviewed with the patient.  Reproductive/Obstetrics                            Anesthesia Physical Anesthesia Plan  ASA: III  Anesthesia Plan: Spinal   Post-op Pain Management:    Induction: Intravenous  PONV Risk Score and Plan: 1 and Propofol infusion and Treatment may vary due to age or medical condition  Airway Management Planned: Natural Airway and Nasal Cannula  Additional Equipment:   Intra-op Plan:   Post-operative Plan:   Informed Consent: I have reviewed the patients History and Physical, chart, labs and discussed the procedure including the risks, benefits and alternatives for the proposed anesthesia with the patient or authorized representative who has indicated his/her understanding and acceptance.     Dental advisory given  Plan Discussed with: CRNA, Anesthesiologist and Surgeon  Anesthesia Plan Comments:         Anesthesia Quick Evaluation

## 2019-12-14 NOTE — Op Note (Signed)
OPERATIVE REPORT- TOTAL HIP ARTHROPLASTY   PREOPERATIVE DIAGNOSIS: Osteoarthritis of the Left hip.   POSTOPERATIVE DIAGNOSIS: Osteoarthritis of the Left  hip.   PROCEDURE: Left total hip arthroplasty, anterior approach.   SURGEON: Gaynelle Arabian, MD   ASSISTANT: Theresa Duty, PA-C  ANESTHESIA:  Spinal  ESTIMATED BLOOD LOSS:-200 mL    DRAINS: Hemovac x1.   COMPLICATIONS: None   CONDITION: PACU - hemodynamically stable.   BRIEF CLINICAL NOTE: Johnny Graves is a 69 y.o. male who has advanced end-  stage arthritis of their Left  hip with progressively worsening pain and  dysfunction.The patient has failed nonoperative management and presents for  total hip arthroplasty.   PROCEDURE IN DETAIL: After successful administration of spinal  anesthetic, the traction boots for the [facility] bed were placed on both  feet and the patient was placed onto the [facility] bed, boots placed into the leg  holders. The Left hip was then isolated from the perineum with plastic  drapes and prepped and draped in the usual sterile fashion. ASIS and  greater trochanter were marked and a oblique incision was made, starting  at about 1 cm lateral and 2 cm distal to the ASIS and coursing towards  the anterior cortex of the femur. The skin was cut with a 10 blade  through subcutaneous tissue to the level of the fascia overlying the  tensor fascia lata muscle. The fascia was then incised in line with the  incision at the junction of the anterior third and posterior 2/3rd. The  muscle was teased off the fascia and then the interval between the TFL  and the rectus was developed. The Hohmann retractor was then placed at  the top of the femoral neck over the capsule. The vessels overlying the  capsule were cauterized and the fat on top of the capsule was removed.  A Hohmann retractor was then placed anterior underneath the rectus  femoris to give exposure to the entire anterior capsule. A T-shaped   capsulotomy was performed. The edges were tagged and the femoral head  was identified.       Osteophytes are removed off the superior acetabulum.  The femoral neck was then cut in situ with an oscillating saw. Traction  was then applied to the left lower extremity utilizing the [facility]  traction. The femoral head was then removed. Retractors were placed  around the acetabulum and then circumferential removal of the labrum was  performed. Osteophytes were also removed. Reaming starts at 51 mm to  medialize and  Increased in 2 mm increments to 53 mm. We reamed in  approximately 40 degrees of abduction, 20 degrees anteversion. A 54 mm  pinnacle acetabular shell was then impacted in anatomic position under  fluoroscopic guidance with excellent purchase. We did not need to place  any additional dome screws. A 36 mm neutral +4 marathon liner was then  placed into the acetabular shell.       The femoral lift was then placed along the lateral aspect of the femur  just distal to the vastus ridge. The leg was  externally rotated and capsule  was stripped off the inferior aspect of the femoral neck down to the  level of the lesser trochanter, this was done with electrocautery. The femur was lifted after this was performed. The  leg was then placed in an extended and adducted position essentially delivering the femur. We also removed the capsule superiorly and the piriformis from the piriformis fossa  to gain excellent exposure of the  proximal femur. Rongeur was used to remove some cancellous bone to get  into the lateral portion of the proximal femur for placement of the  initial starter reamer. The starter broaches was placed  the starter broach  and was shown to go down the center of the canal. Broaching  with the Actis system was then performed starting at size 0  coursing  Up to size 7. A size 7 had excellent torsional and rotational  and axial stability. The trial high offset neck was then placed   with a 36 + 1.5 trial head. The hip was then reduced. We confirmed that  the stem was in the canal both on AP and lateral x-rays. It also has excellent sizing. The hip was reduced with outstanding stability through full extension and full external rotation.. AP pelvis was taken and the leg lengths were measured and found to be equal. Hip was then dislocated again and the femoral head and neck removed. The  femoral broach was removed. Size 7 Actis stem with a high offset  neck was then impacted into the femur following native anteversion. Has  excellent purchase in the canal. Excellent torsional and rotational and  axial stability. It is confirmed to be in the canal on AP and lateral  fluoroscopic views. The 36 + 1.5 ceramic head was placed and the hip  reduced with outstanding stability. Again AP pelvis was taken and it  confirmed that the leg lengths were equal. The wound was then copiously  irrigated with saline solution and the capsule reattached and repaired  with Ethibond suture. 30 ml of .25% Bupivicaine was  injected into the capsule and into the edge of the tensor fascia lata as well as subcutaneous tissue. The fascia overlying the tensor fascia lata was then closed with a running #1 V-Loc. Subcu was closed with interrupted 2-0 Vicryl and subcuticular running 4-0 Monocryl. Incision was cleaned  and dried. Steri-Strips and a bulky sterile dressing applied. Hemovac  drain was hooked to suction and then the patient was awakened and transported to  recovery in stable condition.        Please note that a surgical assistant was a medical necessity for this procedure to perform it in a safe and expeditious manner. Assistant was necessary to provide appropriate retraction of vital neurovascular structures and to prevent femoral fracture and allow for anatomic placement of the prosthesis.  Gaynelle Arabian, M.D.

## 2019-12-14 NOTE — Anesthesia Procedure Notes (Signed)
Spinal  Patient location during procedure: OR Start time: 12/14/2019 10:36 AM End time: 12/14/2019 10:39 AM Staffing Performed: anesthesiologist  Anesthesiologist: Catalina Gravel, MD Preanesthetic Checklist Completed: patient identified, IV checked, risks and benefits discussed, surgical consent, monitors and equipment checked, pre-op evaluation and timeout performed Spinal Block Patient position: sitting Prep: DuraPrep and site prepped and draped Patient monitoring: continuous pulse ox and blood pressure Approach: midline Location: L3-4 Injection technique: single-shot Needle Needle type: Pencan  Needle gauge: 24 G Additional Notes Functioning IV was confirmed and monitors were applied. Sterile prep and drape, including hand hygiene, mask and sterile gloves were used. The patient was positioned and the spine was prepped. The skin was anesthetized with lidocaine.  Free flow of clear CSF was obtained prior to injecting local anesthetic into the CSF.  The spinal needle aspirated freely following injection.  The needle was carefully withdrawn.  The patient tolerated the procedure well. Consent was obtained prior to procedure with all questions answered and concerns addressed. Risks including but not limited to bleeding, infection, nerve damage, paralysis, failed block, inadequate analgesia, allergic reaction, high spinal, itching and headache were discussed and the patient wished to proceed.   Hoy Morn, MD

## 2019-12-14 NOTE — Evaluation (Signed)
Physical Therapy Evaluation Patient Details Name: Johnny Graves MRN: [id number] DOB: [birth year] Today's Date: 12/14/2019   History of Present Illness  Patient is 69 y.o. male s/p Lt THA on 12/14/19 with PMH significant for CAD and OA.  Clinical Impression  Johnny Graves is a 69 y.o. male POD 0 s/p Lt THA anterior approach. Patient reports independence with mobility at baseline. Patient is now limited by functional impairments (see PT problem list below) and requires supervision for transfers and gait with RW. Patient was able to ambulate ~100 feet with RW and supervision and cues for safe walker management. Patient educated on safe sequencing for stair mobility and verbalized safe guarding position for people assisting with mobility. Patient instructed in exercises to facilitate ROM and circulation. Patient will benefit from continued skilled PT interventions to address impairments and progress towards PLOF. Patient has met mobility goals at adequate level for discharge home; will continue to follow if pt continues acute stay to progress towards Mod I goals.    Follow Up Recommendations Follow surgeon's recommendation for DC plan and follow-up therapies    Equipment Recommendations  None recommended by PT    Recommendations for Other Services       Precautions / Restrictions Precautions Precautions: Fall Restrictions Weight Bearing Restrictions: No      Mobility  Bed Mobility Overal bed mobility: Needs Assistance Bed Mobility: Supine to Sit     Supine to sit: Supervision;HOB elevated     General bed mobility comments: no cues or assist required, pt taking extra time  Transfers Overall transfer level: Needs assistance Equipment used: Rolling walker (2 wheeled) Transfers: Sit to/from Stand Sit to Stand: Supervision         General transfer comment: cues for safe hand placement on RW and technique for power up, no assist required to rise, cues for safe reach back to  recliner.  Ambulation/Gait Ambulation/Gait assistance: Supervision Gait Distance (Feet): 100 Feet Assistive device: Rolling walker (2 wheeled) Gait Pattern/deviations: Step-through pattern;Decreased stance time - left;Decreased step length - right;Decreased stride length Gait velocity: slightly decreased   General Gait Details: cues for safe hand placement and step pattern in RW, no overt LOB noted, pt maintained safe proximity throughout  Stairs Stairs: Yes Stairs assistance: Supervision Stair Management: Backwards;No rails;One rail Right;With cane;Step to pattern;Forwards Number of Stairs: 7(1x1, 2x3) General stair comments: pt educated on reverse step up pattern to ascend single threshold with RW, verbal cues for sequencing "up with good, down with bad". educated on forward step to pattern with 1 hand rail and SPC for pt to negotiate stairs in home. pt with good sequencing and steadiness.  Wheelchair Mobility    Modified Rankin (Stroke Patients Only)       Balance Overall balance assessment: Needs assistance Sitting-balance support: Feet supported Sitting balance-Leahy Scale: Good     Standing balance support: During functional activity;Bilateral upper extremity supported Standing balance-Leahy Scale: Fair                Pertinent Vitals/Pain Pain Assessment: 0-10 Pain Score: 2  Pain Location: Lt hip Pain Descriptors / Indicators: Discomfort Pain Intervention(s): Monitored during session;Limited activity within patient's tolerance    Home Living Family/patient expects to be discharged to:: Private residence Living Arrangements: Spouse/significant other Available Help at Discharge: Family;Available 24 hours/day Type of Home: House Home Access: Stairs to enter Entrance Stairs-Rails: None Entrance Stairs-Number of Steps: 1 Home Layout: Two level;Laundry or work area in basement;Able to live on main level with bedroom/bathroom  Home Equipment: [place] - 2  wheels;Cane - single point;Bedside commode;Shower seat - built in;Grab bars - tub/shower      Prior Function Level of Independence: Independent         Comments: pt independent, has been practicing with SPC in preparation for surgery     Hand Dominance   Dominant Hand: Right    Extremity/Trunk Assessment   Upper Extremity Assessment Upper Extremity Assessment: Overall WFL for tasks assessed    Lower Extremity Assessment Lower Extremity Assessment: LLE deficits/detail LLE Deficits / Details: good quad activation, able to complete LAQ LLE Sensation: WNL LLE Coordination: WNL    Cervical / Trunk Assessment Cervical / Trunk Assessment: Normal  Communication   Communication: No difficulties  Cognition Arousal/Alertness: Awake/alert Behavior During Therapy: WFL for tasks assessed/performed Overall Cognitive Status: Within Functional Limits for tasks assessed           General Comments      Exercises Total Joint Exercises Ankle Circles/Pumps: AROM;10 reps;Supine;Both Quad Sets: AROM;5 reps;Supine;Left Short Arc Quad: AROM;5 reps;Supine;Left Heel Slides: 5 reps;Supine;AAROM;Left Long Arc Quad: AROM;5 reps;Seated;Left   Assessment/Plan    PT Assessment Patient needs continued PT services  PT Problem List Decreased strength;Decreased balance;Decreased range of motion;Decreased mobility;Decreased activity tolerance;Decreased knowledge of use of DME       PT Treatment Interventions DME instruction;Functional mobility training;Balance training;Patient/family education;Gait training;Therapeutic activities;Therapeutic exercise;Stair training    PT Goals (Current goals can be found in the Care Plan section)  Acute Rehab PT Goals Patient Stated Goal: to get home and get walking better and playing golf again PT Goal Formulation: With patient Time For Goal Achievement: 12/21/19 Potential to Achieve Goals: Good    Frequency 7X/week    AM-PAC PT "6 Clicks" Mobility   Outcome Measure Help needed turning from your back to your side while in a flat bed without using bedrails?: A Little Help needed moving from lying on your back to sitting on the side of a flat bed without using bedrails?: A Little Help needed moving to and from a bed to a chair (including a wheelchair)?: A Little Help needed standing up from a chair using your arms (e.g., wheelchair or bedside chair)?: A Little Help needed to walk in hospital room?: A Little Help needed climbing 3-5 steps with a railing? : A Little 6 Click Score: 18    End of Session Equipment Utilized During Treatment: Gait belt Activity Tolerance: Patient tolerated treatment well Patient left: with call bell/phone within reach;in chair Nurse Communication: Mobility status PT Visit Diagnosis: Muscle weakness (generalized) (M62.81);Difficulty in walking, not elsewhere classified (R26.2)    Time: 6834-1962 PT Time Calculation (min) (ACUTE ONLY): 29 min   Charges:   PT Evaluation $PT Eval Low Complexity: 1 Low PT Treatments $Gait Training: 8-22 mins        Verner Mould, DPT Physical Therapist with [facility] [phone]  12/14/2019 5:17 PM

## 2019-12-14 NOTE — Transfer of Care (Signed)
Immediate Anesthesia Transfer of Care Note  Patient: Johnny Graves  Procedure(s) Performed: TOTAL HIP ARTHROPLASTY ANTERIOR APPROACH (Left Hip)  Patient Location: PACU  Anesthesia Type:Spinal  Level of Consciousness: awake, alert  and oriented  Airway & Oxygen Therapy: Patient Spontanous Breathing and Patient connected to face mask oxygen  Post-op Assessment: Report given to RN and Post -op Vital signs reviewed and stable  Post vital signs: Reviewed and stable  Last Vitals:  Vitals Value Taken Time  BP 116/60 12/14/19 1208  Temp    Pulse 73 12/14/19 1210  Resp 15 12/14/19 1210  SpO2 100 % 12/14/19 1210  Vitals shown include unvalidated device data.  Last Pain:  Vitals:   12/14/19 0917  TempSrc: Oral  PainSc: 0-No pain      Patients Stated Pain Goal: 4 ([id number])  Complications: No apparent anesthesia complications

## 2019-12-14 NOTE — Discharge Instructions (Addendum)
Dr. Gaynelle Arabian Total Joint Specialist [facility] [address] [place], [place] [address] [phone]       ANTERIOR APPROACH TOTAL HIP REPLACEMENT POSTOPERATIVE DIRECTIONS FOR SAME DAY DISCHARGE    Hip Rehabilitation, Guidelines Following Surgery  The results of a hip operation are greatly improved after range of motion and muscle strengthening exercises. Follow all safety measures which are given to protect your hip. If any of these exercises cause increased pain or swelling in your joint, decrease the amount until you are comfortable again. Then slowly increase the exercises. Call your caregiver if you have problems or questions.   BLOOD CLOT PREVENTION  . Take a 325 mg Aspirin once a day with your normal dose of Brilinta for three weeks. . Then, resume one 81 mg aspirin once a day with normal dose of Brilinta  HOME CARE INSTRUCTIONS  . Remove items at home which could result in a fall. This includes throw rugs or furniture in walking pathways.   ICE to the affected hip as frequently as 20-30 minutes an hour and then as needed for pain and swelling.  Continue to use ice on the hip for pain and swelling from surgery. You may notice swelling that will progress down to the foot and ankle.  This is normal after surgery.  Elevate the leg when you are not up walking on it.    Continue to use the breathing machine which will help keep your temperature down.  It is common for your temperature to cycle up and down following surgery, especially at night when you are not up moving around and exerting yourself.  The breathing machine keeps your lungs expanded and your temperature down.  DIET You may resume your previous home diet once you are discharged from the hospital.  DRESSING / WOUND CARE / SHOWERING You have an adhesive waterproof bandage on. You may start showering 3 days after surgery with this bandage on. You may remove it after 7-10 days and do not need to dress  the wound after that unless there is drainage.  ACTIVITY . For the first 3-5 days it is important to rest and keep the operative leg elevated. You should, as a general rule, rest for 50 minutes per hour and get up and walk/stretch for 10 minutes per hour. After 5 days you can slowly increase activity as tolerated. Marland Kitchen Perform the exercises you were provided twice a day for about 15-20 minutes each session. Begin these 2 days after surgery.  WEIGHT BEARING . You may bear weight as tolerated on your operative leg using a walker for assistance . Walk with your walker as instructed. Use the walker until you are comfortable transitioning to a cane. Walk with the cane in the opposite hand of the operative leg. You may discontinue the cane once you are comfortable and walking steadily.  POSTOPERATIVE CONSTIPATION PROTOCOL Constipation - defined medically as fewer than three stools per week and severe constipation as less than one stool per week.  One of the most common issues patients have following surgery is constipation.  Even if you have a regular bowel pattern at home, your normal regimen is likely to be disrupted due to multiple reasons following surgery.  Combination of anesthesia, postoperative narcotics, change in appetite and fluid intake all can affect your bowels.  In order to avoid complications following surgery, here are some recommendations in order to help you during your recovery period.  Colace (docusate) - Pick up an  over-the-counter form of Colace or another stool softener and take twice a day as long as you are requiring postoperative pain medications.  Take with a full glass of water daily.  If you experience loose stools or diarrhea, hold the colace until you stool forms back up.  If your symptoms do not get better within 1 week or if they get worse, check with your doctor  MiraLax (polyethylene glycol) - Pick up over-the-counter to have on hand.  MiraLax is a solution that will  increase the amount of water in your bowels to assist with bowel movements.  Take as directed and can mix with a glass of water, juice, soda, coffee, or tea.  Take if you go more than two days without a movement. Do not use MiraLax more than once per day. Call your doctor if you are still constipated or irregular after using this medication for 5 days in a row.  If you continue to have problems with postoperative constipation, please contact the office for further assistance and recommendations.  If you experience "the worst abdominal pain ever" or develop nausea or vomiting, please contact the office immediatly for further recommendations for treatment.  ITCHING  If you experience itching with your medications, try taking only a single pain pill, or even half a pain pill at a time.  You can also use Benadryl over the counter for itching or also to help with sleep.   TED HOSE STOCKINGS Wear the elastic stockings on both legs for three weeks following surgery during the day but you may remove then at night for sleeping.  MEDICATIONS See your medication summary on the "After Visit Summary" that the nursing staff will review with you prior to discharge.  You may have some home medications which will be placed on hold until you complete the course of blood thinner medication.  It is important for you to complete the blood thinner medication as prescribed by your surgeon.  Continue your approved medications as instructed at time of discharge.  PRECAUTIONS If you experience chest pain or shortness of breath - call 911 immediately for transfer to the hospital emergency department.  If you develop a fever greater that 101 F, purulent drainage from wound, increased redness or drainage from wound, foul odor from the wound/dressing, or calf pain - CONTACT YOUR SURGEON.                                                   FOLLOW-UP APPOINTMENTS Make sure you keep all of your appointments after your operation  with your surgeon and caregivers. You should call the office at the above phone number and make an appointment for approximately two weeks after the date of your surgery or on the date instructed by your surgeon outlined in the "After Visit Summary".  MAKE SURE YOU:  . Understand these instructions.  . Get help right away if you are not doing well or get worse.    Pick up stool softner and laxative for home use following surgery while on pain medications. May shower starting three days after surgery. Continue to use ice for pain and swelling after surgery. Do not use any lotions or creams on the incision until instructed by your surgeon.

## 2019-12-15 ENCOUNTER — Encounter: Payer: Self-pay | Admitting: *Deleted

## 2019-12-16 NOTE — Discharge Summary (Signed)
Physician Discharge Summary   Patient ID: Johnny Graves MRN: [id number] DOB/AGE: 05-15-1951 69 y.o.  Admit date: 12/14/2019 Discharge date: 12/14/2019  Primary Diagnosis: Osteoarthritis, left hip   Admission Diagnoses:  Past Medical History:  Diagnosis Date  . Arthritis   . Cancer (HCC)    Skin-Squamous Cell  . Coronary artery disease    Discharge Diagnoses:   Active Problems:   S/P total hip arthroplasty  Estimated body mass index is 22.35 kg/m as calculated from the following:   Height as of this encounter: 6' (1.829 m).   Weight as of this encounter: 74.8 kg.  Procedure:  Procedure(s) (LRB): TOTAL HIP ARTHROPLASTY ANTERIOR APPROACH (Left)   Consults: None  HPI: Johnny Graves is a 69 y.o. male who has advanced end-stage arthritis of their Left  hip with progressively worsening pain and dysfunction.The patient has failed nonoperative management and presents for total hip arthroplasty.   Laboratory Data: Hospital Outpatient Visit on 12/10/2019  Component Date Value Ref Range Status  . SARS-CoV-2, NAA 12/10/2019 NOT DETECTED  NOT DETECTED Final   Comment: (NOTE) This nucleic acid amplification test was developed and its performance characteristics determined by [employer]. Nucleic acid amplification tests include PCR and TMA. This test has not been FDA cleared or approved. This test has been authorized by FDA under an Emergency Use Authorization (EUA). This test is only authorized for the duration of time the declaration that circumstances exist justifying the authorization of the emergency use of in vitro diagnostic tests for detection of SARS-CoV-2 virus and/or diagnosis of COVID-19 infection under section 564(b)(1) of the Act, 21 U.S.C. [id number]) (1), unless the authorization is terminated or revoked sooner. When diagnostic testing is negative, the possibility of a false negative result should be considered in the context of a patient's recent  exposures and the presence of clinical signs and symptoms consistent with COVID-19. An individual without symptoms of COVID- 19 and who is not shedding SARS-CoV-2 vi                          rus would expect to have a negative (not detected) result in this assay. Performed At: [facility] [place], [place] [id number] Rush Farmer MD [id number]   . Coronavirus Source 12/10/2019 NASOPHARYNGEAL   Final   Performed at [facility], [place] [address] [place], [place] [address]  Hospital Outpatient Visit on 12/06/2019  Component Date Value Ref Range Status  . aPTT 12/06/2019 30  24 - 36 seconds Final   Performed at [facility], [place] [address], [place], [place] [address]  . WBC 12/06/2019 6.7  4.0 - 10.5 K/uL Final  . RBC 12/06/2019 3.92* 4.22 - 5.81 MIL/uL Final  . Hemoglobin 12/06/2019 12.8* 13.0 - 17.0 g/dL Final  . HCT 12/06/2019 38.8* 39.0 - 52.0 % Final  . MCV 12/06/2019 99.0  80.0 - 100.0 fL Final  . MCH 12/06/2019 32.7  26.0 - 34.0 pg Final  . MCHC 12/06/2019 33.0  30.0 - 36.0 g/dL Final  . RDW 12/06/2019 12.9  11.5 - 15.5 % Final  . Platelets 12/06/2019 241  150 - 400 K/uL Final  . nRBC 12/06/2019 0.0  0.0 - 0.2 % Final   Performed at [facility], [place] [address] [place], [place] [address]  . Sodium 12/06/2019 141  135 - 145 mmol/L Final  . Potassium 12/06/2019 4.2  3.5 - 5.1 mmol/L Final  .  Chloride 12/06/2019 107  98 - 111 mmol/L Final  . CO2 12/06/2019 27  22 - 32 mmol/L Final  . Glucose, Bld 12/06/2019 90  70 - 99 mg/dL Final  . BUN 12/06/2019 18  8 - 23 mg/dL Final  . Creatinine, Ser 12/06/2019 0.91  0.61 - 1.24 mg/dL Final  . Calcium 12/06/2019 8.8* 8.9 - 10.3 mg/dL Final  . Total Protein 12/06/2019 6.4* 6.5 - 8.1 g/dL Final  . Albumin 12/06/2019 4.0  3.5 - 5.0 g/dL Final  . AST 12/06/2019 22  15 - 41 U/L Final  . ALT 12/06/2019 25  0 - 44 U/L Final  . Alkaline Phosphatase 12/06/2019 72  38 - 126 U/L  Final  . Total Bilirubin 12/06/2019 1.1  0.3 - 1.2 mg/dL Final  . GFR calc non Af Amer 12/06/2019 >60  >60 mL/min Final  . GFR calc Af Amer 12/06/2019 >60  >60 mL/min Final  . Anion gap 12/06/2019 7  5 - 15 Final   Performed at [facility], [place] [address], [place], [place] [address]  . Prothrombin Time 12/06/2019 13.1  11.4 - 15.2 seconds Final  . INR 12/06/2019 1.0  0.8 - 1.2 Final   Comment: (NOTE) INR goal varies based on device and disease states. Performed at [facility], [place] [address], [place], [place] [address]   . ABO/RH(D) 12/06/2019 O POS   Final  . Antibody Screen 12/06/2019 NEG   Final  . Sample Expiration 12/06/2019 12/17/2019,2359   Final  . Extend sample reason 12/06/2019    Final                   Value:NO TRANSFUSIONS OR PREGNANCY IN THE PAST 3 MONTHS Performed at [facility], [place] [address] [place], [place] [address]   . MRSA, PCR 12/06/2019 NEGATIVE  NEGATIVE Final  . Staphylococcus aureus 12/06/2019 NEGATIVE  NEGATIVE Final   Comment: (NOTE) The Xpert SA Assay (FDA approved for NASAL specimens in patients 49 years of age and older), is one component of a comprehensive surveillance program. It is not intended to diagnose infection nor to guide or monitor treatment. Performed at [facility], [place] [address], [place],  [address]      X-Rays:DG Pelvis Portable  Result Date: 12/14/2019 CLINICAL DATA:  Status post left hip replacement today. EXAM: PORTABLE PELVIS 1-2 VIEWS COMPARISON:  Intraoperative imaging left hip today. FINDINGS: Left total hip arthroplasty is in place. The device is located. No fracture or other acute abnormality. Surgical staples noted. IMPRESSION: Status post left hip replacement today.  No acute finding. Electronically Signed   By: Inge Rise M.D.   On: 12/14/2019 12:47   DG C-Arm 1-60 Min-No Report  Result Date: 12/14/2019 Fluoroscopy was utilized  by the requesting physician.  No radiographic interpretation.   DG HIP OPERATIVE UNILAT W OR W/O PELVIS LEFT  Result Date: 12/14/2019 CLINICAL DATA:  Intraoperative imaging for left hip replacement. EXAM: OPERATIVE LEFT HIP (WITH PELVIS IF PERFORMED) 4 VIEWS TECHNIQUE: Fluoroscopic spot image(s) were submitted for interpretation post-operatively. COMPARISON:  Single view of the pelvis 02/22/2018. FINDINGS: 4 fluoroscopic spot views are provided. The initial 2 images demonstrate severe left hip osteoarthritis. On the final 2 images, a left hip arthroplasty is in place. No acute abnormality. IMPRESSION: Intraoperative imaging for left hip replacement.  No acute finding. Electronically Signed   By: Inge Rise M.D.   On: 12/14/2019 11:55    EKG:No orders found  for this or any previous visit.   Hospital Course: Johnny Graves is a 69 y.o. who was admitted to [facility]. They were brought to the operating room on 12/14/2019 and underwent Procedure(s): [place].  Patient tolerated the procedure well and was later transferred to the recovery room for postoperative care. They were given PO and IV analgesics for pain control following their surgery. They were given 24 hours of postoperative antibiotics of  Anti-infectives (From admission, onward)   Start     Dose/Rate Route Frequency Ordered Stop   12/14/19 1700  ceFAZolin (ANCEF) IVPB 2g/100 mL premix  Status:  Discontinued     2 g 200 mL/hr over 30 Minutes Intravenous Every 6 hours 12/14/19 1206 12/14/19 2345   12/14/19 0915  ceFAZolin (ANCEF) IVPB 2g/100 mL premix     2 g 200 mL/hr over 30 Minutes Intravenous On call to O.R. 12/14/19 0901 12/14/19 1115     and started on DVT prophylaxis in the form of Aspirin 325 mg and Brilinta. Physical therapy was ordered for total joint protocol. Patient received three normal saline boluses in recovery and completed a session of physical therapy. Pain was well  controlled with medications, patient was able to void on his own, and was meeting his goals with therapy. Pt was discharged to home on a same day discharge in stable condition.  Diet: Cardiac diet Activity: WBAT Follow-up: in 2 weeks Disposition: Home with HEP Discharged Condition: stable   Discharge Instructions    Call MD / Call 911   Complete by: As directed    If you experience chest pain or shortness of breath, CALL 911 and be transported to the hospital emergency room.  If you develope a fever above 101 F, pus (white drainage) or increased drainage or redness at the wound, or calf pain, call your surgeon's office.   Constipation Prevention   Complete by: As directed    Drink plenty of fluids.  Prune juice may be helpful.  You may use a stool softener, such as Colace (over the counter) 100 mg twice a day.  Use MiraLax (over the counter) for constipation as needed.   Diet - low sodium heart healthy   Complete by: As directed    Discharge instructions   Complete by: As directed    ?  Dr. Gaynelle Arabian Total Joint Specialist [facility] [address] [place], [place] [address] [phone]       ANTERIOR APPROACH TOTAL HIP REPLACEMENT POSTOPERATIVE DIRECTIONS FOR SAME DAY DISCHARGE  ?  Hip Rehabilitation, Guidelines Following Surgery  The results of a hip operation are greatly improved after range of motion and muscle strengthening exercises. Follow all safety measures which are given to protect your hip. If any of these exercises cause increased pain or swelling in your joint, decrease the amount until you are comfortable again. Then slowly increase the exercises. Call your caregiver if you have problems or questions.   HOME CARE INSTRUCTIONS  Remove items at home which could result in a fall. This includes throw rugs or furniture in walking pathways.  ICE to the affected hip as frequently as 20-30 minutes an hour and then as needed for pain and swelling.   Continue to use ice on the hip for pain and swelling from surgery. You may notice swelling that will progress down to the foot and ankle.  This is normal after surgery.  Elevate the leg when you are not up walking on  it.   Continue to use the breathing machine which will help keep your temperature down.  It is common for your temperature to cycle up and down following surgery, especially at night when you are not up moving around and exerting yourself.  The breathing machine keeps your lungs expanded and your temperature down.  DIET You may resume your previous home diet once you are discharged from the hospital.  DRESSING / WOUND CARE / SHOWERING You have an adhesive waterproof bandage on. You may start showering 3 days after surgery with this bandage on. You may remove it after 7-10 days and do not need to dress the wound after that unless there is drainage.  ACTIVITY For the first 3-5 days it is important to rest and keep the operative leg elevated. You should, as a general rule, rest for 50 minutes per hour and get up and walk/stretch for 10 minutes per hour. After 5 days you can slowly increase activity as tolerated. Perform the exercises you were provided twice a day for about 15-20 minutes each session. Begin these 2 days after surgery.  WEIGHT BEARING You may bear weight as tolerated on your operative leg using a walker for assistance Walk with your walker as instructed. Use the walker until you are comfortable transitioning to a cane. Walk with the cane in the opposite hand of the operative leg. You may discontinue the cane once you are comfortable and walking steadily.  POSTOPERATIVE CONSTIPATION PROTOCOL Constipation - defined medically as fewer than three stools per week and severe constipation as less than one stool per week.  One of the most common issues patients have following surgery is constipation.  Even if you have a regular bowel pattern at home, your normal regimen is likely  to be disrupted due to multiple reasons following surgery.  Combination of anesthesia, postoperative narcotics, change in appetite and fluid intake all can affect your bowels.  In order to avoid complications following surgery, here are some recommendations in order to help you during your recovery period.  Colace (docusate) - Pick up an over-the-counter form of Colace or another stool softener and take twice a day as long as you are requiring postoperative pain medications.  Take with a full glass of water daily.  If you experience loose stools or diarrhea, hold the colace until you stool forms back up.  If your symptoms do not get better within 1 week or if they get worse, check with your doctor  MiraLax (polyethylene glycol) - Pick up over-the-counter to have on hand.  MiraLax is a solution that will increase the amount of water in your bowels to assist with bowel movements.  Take as directed and can mix with a glass of water, juice, soda, coffee, or tea.  Take if you go more than two days without a movement. Do not use MiraLax more than once per day. Call your doctor if you are still constipated or irregular after using this medication for 5 days in a row.  If you continue to have problems with postoperative constipation, please contact the office for further assistance and recommendations.  If you experience "the worst abdominal pain ever" or develop nausea or vomiting, please contact the office immediatly for further recommendations for treatment.  ITCHING  If you experience itching with your medications, try taking only a single pain pill, or even half a pain pill at a time.  You can also use Benadryl over the counter for itching or also to help with  sleep.   TED HOSE STOCKINGS Wear the elastic stockings on both legs for three weeks following surgery during the day but you may remove then at night for sleeping.  MEDICATIONS See your medication summary on the "After Visit Summary" that the  nursing staff will review with you prior to discharge.  You may have some home medications which will be placed on hold until you complete the course of blood thinner medication.  It is important for you to complete the blood thinner medication as prescribed by your surgeon.  Continue your approved medications as instructed at time of discharge.  PRECAUTIONS If you experience chest pain or shortness of breath - call 911 immediately for transfer to the hospital emergency department.  If you develop a fever greater that 101 F, purulent drainage from wound, increased redness or drainage from wound, foul odor from the wound/dressing, or calf pain - CONTACT YOUR SURGEON.                                                   FOLLOW-UP APPOINTMENTS Make sure you keep all of your appointments after your operation with your surgeon and caregivers. You should call the office at the above phone number and make an appointment for approximately two weeks after the date of your surgery or on the date instructed by your surgeon outlined in the "After Visit Summary".  MAKE SURE YOU:  Understand these instructions.  Get help right away if you are not doing well or get worse.    Pick up stool softner and laxative for home use following surgery while on pain medications. May shower starting three days after surgery. Continue to use ice for pain and swelling after surgery. Do not use any lotions or creams on the incision until instructed by your surgeon.   Increase activity slowly as tolerated   Complete by: As directed    Outpatient / Same Day Surgery   Complete by: As directed      Allergies as of 12/14/2019   No Known Allergies     Medication List    STOP taking these medications   diclofenac 75 MG EC tablet Commonly known as: VOLTAREN     TAKE these medications   aspirin EC 325 MG tablet Take 1 tablet (325 mg total) by mouth daily for 21 days. Then resume one 81 mg aspirin once a day. What changed:    medication strength  how much to take  additional instructions   atorvastatin 40 MG tablet Commonly known as: LIPITOR Take 40 mg by mouth every evening.   Brilinta 60 MG Tabs tablet Generic drug: ticagrelor Take 60 mg by mouth 2 (two) times daily.   chlorproMAZINE 10 MG tablet Commonly known as: THORAZINE Take 1 tablet (10 mg total) by mouth 2 (two) times daily for 7 days.   HYDROcodone-acetaminophen 5-325 MG tablet Commonly known as: NORCO/VICODIN Take 1 tablet by mouth every 6 (six) hours as needed for severe pain.   methocarbamol 500 MG tablet Commonly known as: Robaxin Take 1 tablet (500 mg total) by mouth every 6 (six) hours as needed for muscle spasms.   nitroGLYCERIN 0.4 MG SL tablet Commonly known as: NITROSTAT Place 0.4 mg under the tongue every 5 (five) minutes x 2 doses as needed for chest pain.   traMADol 50 MG tablet Commonly known as:  Ultram Take 1-2 tablets (50-100 mg total) by mouth every 6 (six) hours as needed for moderate pain.      Follow-up Information    Gaynelle Arabian, MD. Schedule an appointment as soon as possible for a visit on 12/29/2019.   Specialty: Orthopedic Surgery Contact information: [address] [place] [address] [phone]           Signed: Theresa Duty, PA-C Orthopedic Surgery 12/16/2019, 11:02 AM

## 2020-01-09 IMAGING — DX DG PORTABLE PELVIS
1 series · 1 of 1 positions shown · non-contrast
Comparison: Intraoperative imaging left hip today.

CLINICAL DATA: Status post left hip replacement today.

EXAM:
PORTABLE PELVIS 1-2 VIEWS

[pelvis ap]
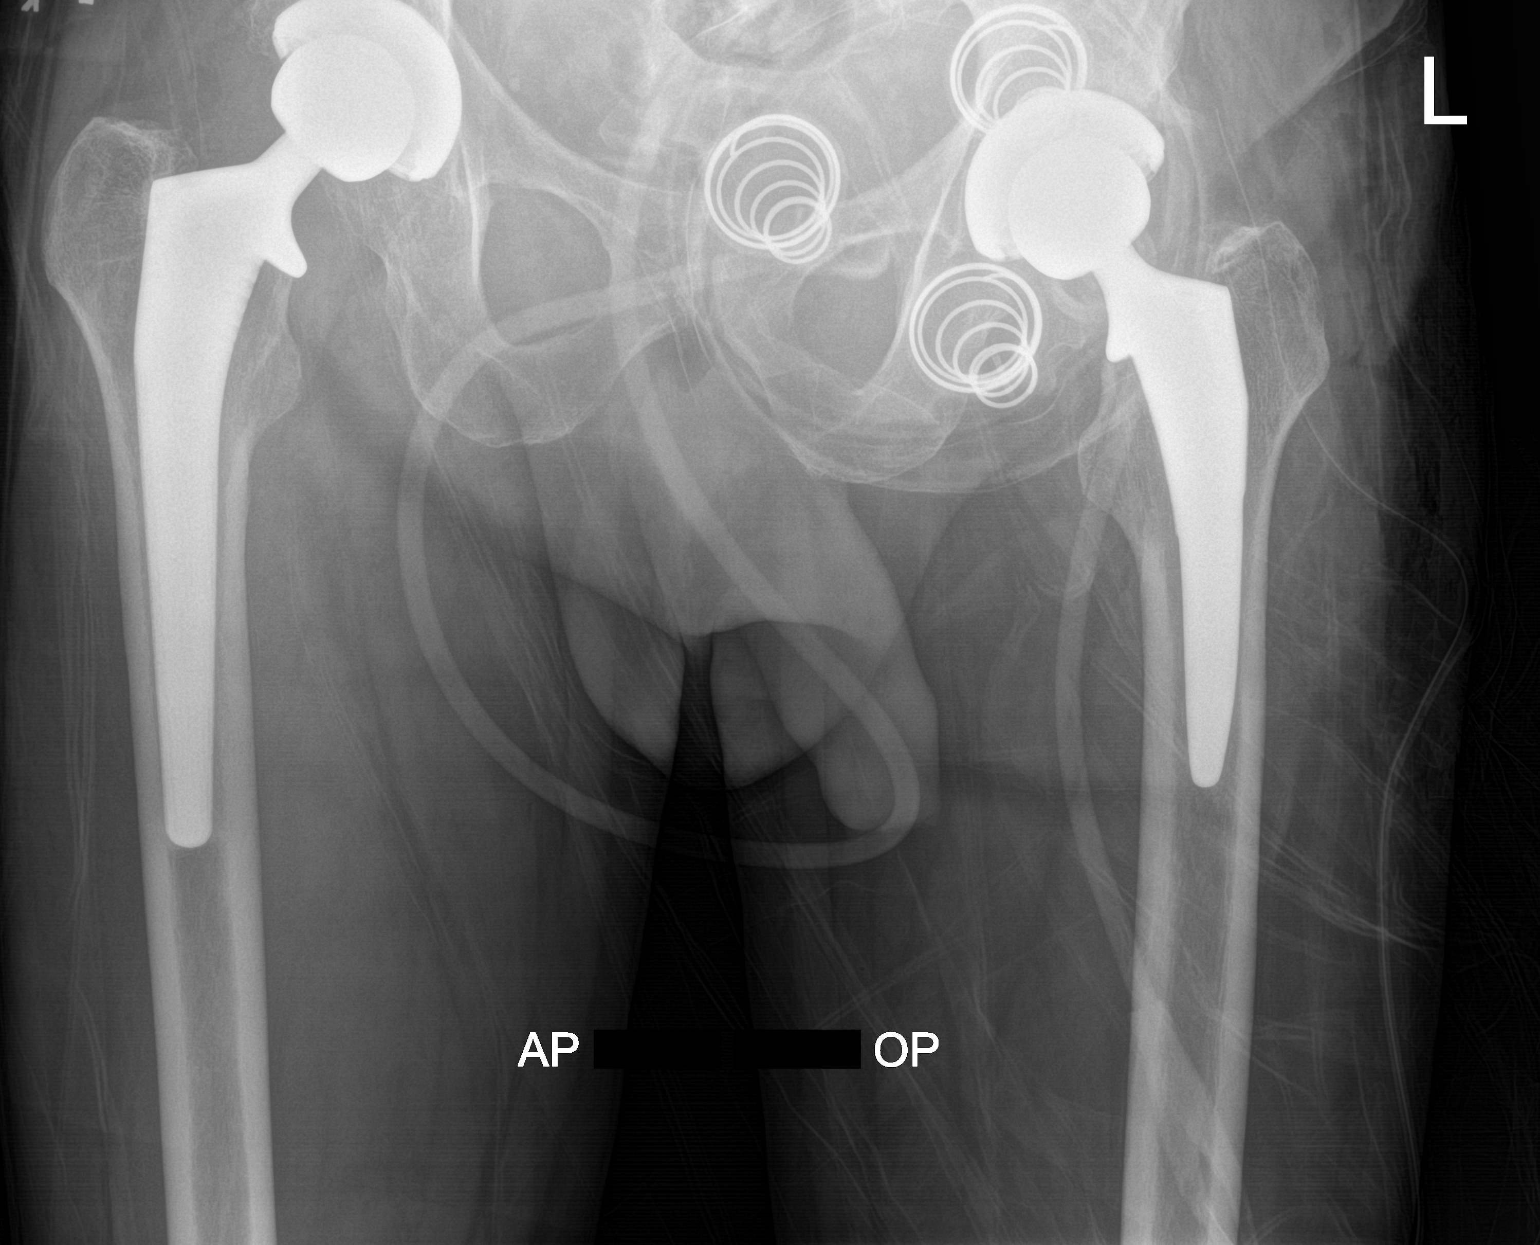

[1 of 1 positions shown; findings below may reference images not displayed]

FINDINGS: Left total hip arthroplasty is in place. The device is located. No
fracture or other acute abnormality. Surgical staples noted.
IMPRESSION: Status post left hip replacement today.  No acute finding.

## 2020-01-09 IMAGING — RF DG HIP (WITH PELVIS) OPERATIVE*L*
1 series · 4 of 4 positions shown · non-contrast
Comparison: Single view of the pelvis 02/22/2018.

CLINICAL DATA: Intraoperative imaging for left hip replacement.

EXAM:
OPERATIVE LEFT HIP (WITH PELVIS IF PERFORMED) 4 VIEWS
TECHNIQUE: Fluoroscopic spot image(s) were submitted for interpretation
post-operatively.

[Series 1: unknown protocol · 0.20mm/px · 4 of 4 slices shown]
[im 1/4]
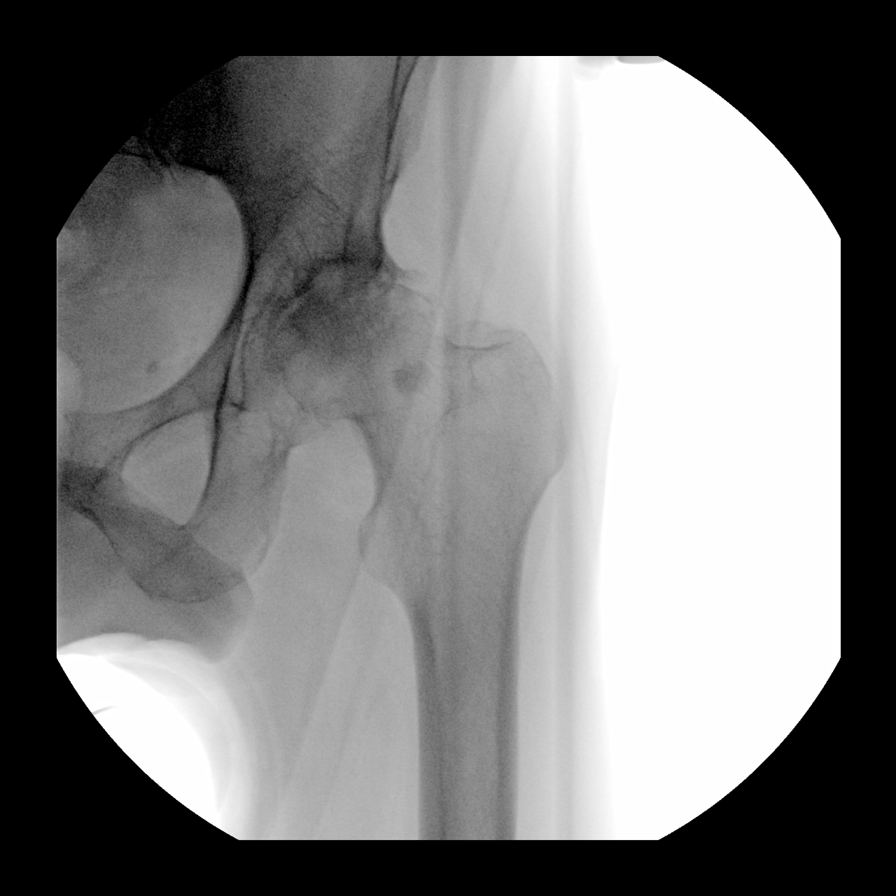
[im 2/4]
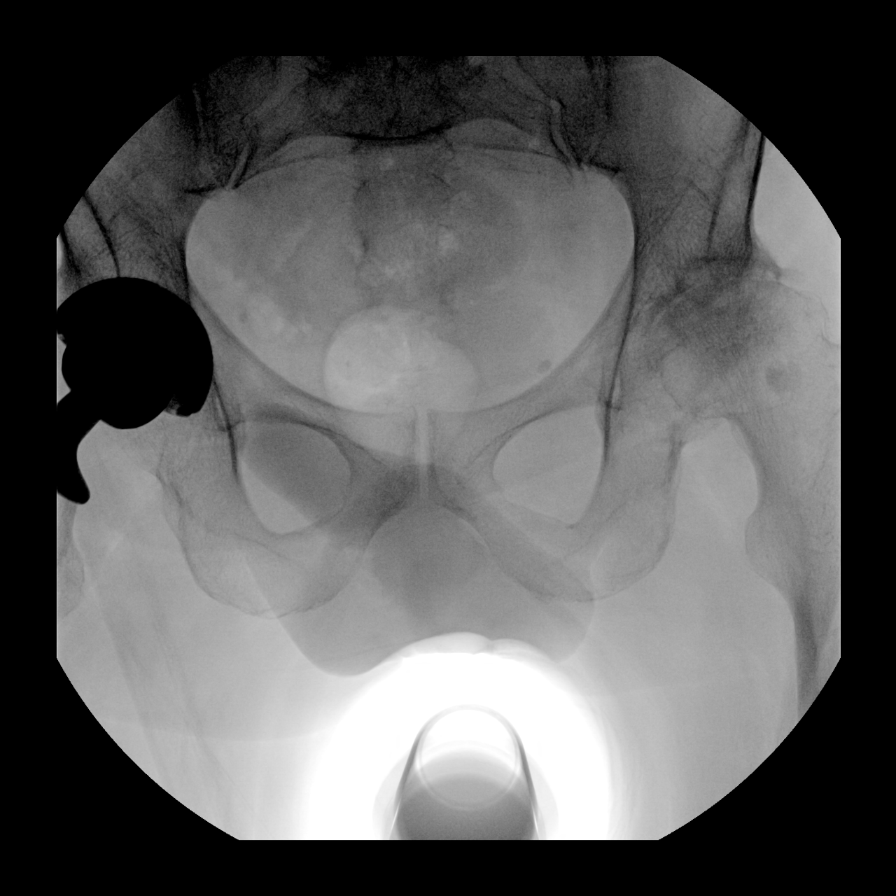
[im 3/4]
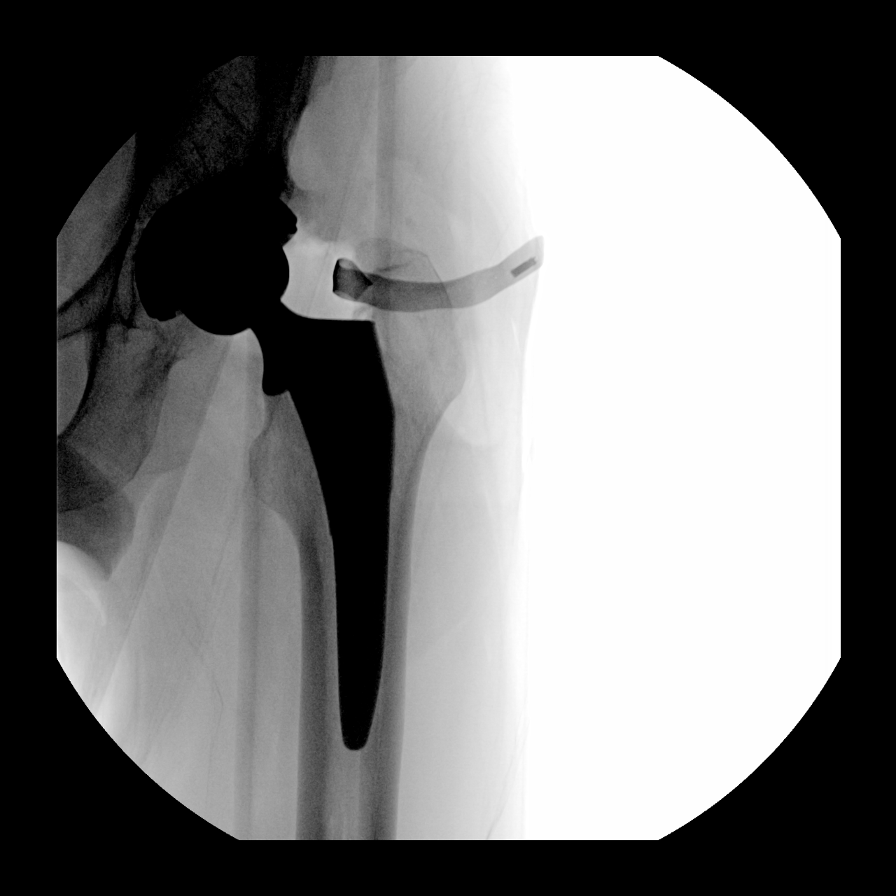
[im 4/4]
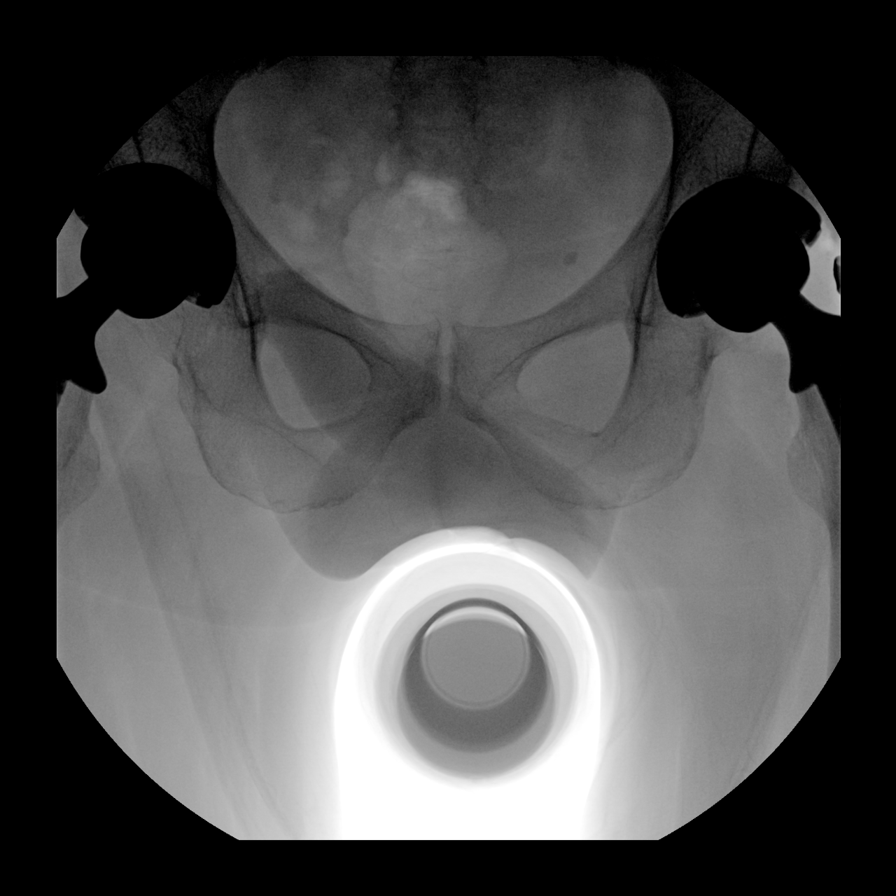

[4 of 4 positions shown; findings below may reference images not displayed]

FINDINGS: 4 fluoroscopic spot views are provided. The initial 2 images
demonstrate severe left hip osteoarthritis. On the final 2 images, a
left hip arthroplasty is in place. No acute abnormality.
IMPRESSION: Intraoperative imaging for left hip replacement.  No acute finding.
